# Patient Record
Sex: Male | Born: 1960 | Race: White | Hispanic: No | Marital: Married | Smoking: Never smoker
Health system: Southern US, Community
[De-identification: ages and names within clinical notes are randomized; demographics above are authoritative.]

## PROBLEM LIST (undated history)

## (undated) DIAGNOSIS — R011 Cardiac murmur, unspecified: Secondary | ICD-10-CM

## (undated) DIAGNOSIS — C61 Malignant neoplasm of prostate: Secondary | ICD-10-CM

## (undated) DIAGNOSIS — J309 Allergic rhinitis, unspecified: Secondary | ICD-10-CM

## (undated) DIAGNOSIS — E119 Type 2 diabetes mellitus without complications: Secondary | ICD-10-CM

## (undated) DIAGNOSIS — M1990 Unspecified osteoarthritis, unspecified site: Secondary | ICD-10-CM

## (undated) DIAGNOSIS — G4733 Obstructive sleep apnea (adult) (pediatric): Secondary | ICD-10-CM

## (undated) DIAGNOSIS — E785 Hyperlipidemia, unspecified: Secondary | ICD-10-CM

## (undated) DIAGNOSIS — K219 Gastro-esophageal reflux disease without esophagitis: Secondary | ICD-10-CM

## (undated) DIAGNOSIS — Z800 Family history of malignant neoplasm of digestive organs: Secondary | ICD-10-CM

## (undated) DIAGNOSIS — G4730 Sleep apnea, unspecified: Secondary | ICD-10-CM

## (undated) DIAGNOSIS — C801 Malignant (primary) neoplasm, unspecified: Secondary | ICD-10-CM

## (undated) DIAGNOSIS — T7840XA Allergy, unspecified, initial encounter: Secondary | ICD-10-CM

## (undated) DIAGNOSIS — I10 Essential (primary) hypertension: Secondary | ICD-10-CM

## (undated) HISTORY — DX: Essential (primary) hypertension: I10

## (undated) HISTORY — PX: HAND SURGERY: SHX662

## (undated) HISTORY — DX: Allergy, unspecified, initial encounter: T78.40XA

## (undated) HISTORY — PX: BASAL CELL CARCINOMA EXCISION: SHX1214

## (undated) HISTORY — DX: Family history of malignant neoplasm of digestive organs: Z80.0

## (undated) HISTORY — PX: NASAL SINUS SURGERY: SHX719

## (undated) HISTORY — DX: Gastro-esophageal reflux disease without esophagitis: K21.9

## (undated) HISTORY — DX: Malignant (primary) neoplasm, unspecified: C80.1

## (undated) HISTORY — DX: Allergic rhinitis, unspecified: J30.9

## (undated) HISTORY — DX: Hyperlipidemia, unspecified: E78.5

## (undated) HISTORY — DX: Unspecified osteoarthritis, unspecified site: M19.90

## (undated) HISTORY — DX: Sleep apnea, unspecified: G47.30

## (undated) HISTORY — DX: Cardiac murmur, unspecified: R01.1

## (undated) HISTORY — DX: Type 2 diabetes mellitus without complications: E11.9

## (undated) HISTORY — DX: Obstructive sleep apnea (adult) (pediatric): G47.33

## (undated) HISTORY — PX: PROSTATE BIOPSY: SHX241

## (undated) HISTORY — PX: KNEE SURGERY: SHX244

---

## 2013-08-17 ENCOUNTER — Ambulatory Visit: Payer: Self-pay | Admitting: Podiatry

## 2013-08-22 ENCOUNTER — Encounter: Payer: Self-pay | Admitting: Podiatry

## 2013-08-22 ENCOUNTER — Ambulatory Visit (INDEPENDENT_AMBULATORY_CARE_PROVIDER_SITE_OTHER): Payer: BC Managed Care – PPO | Admitting: Podiatry

## 2013-08-22 VITALS — BP 150/94 | HR 69 | Resp 12

## 2013-08-22 DIAGNOSIS — B351 Tinea unguium: Secondary | ICD-10-CM

## 2013-08-22 NOTE — Progress Notes (Signed)
Subjective:     Patient ID: Brian Beasley, male   DOB: 10/25/1961, 52 y.o.   MRN: [id number]  HPI patient states they seem a little better but I still have yellow discoloration especially in my right big toenail   Review of Systems  All other systems reviewed and are negative.       Objective:   Physical Exam  Nursing note and vitals reviewed. Cardiovascular: Intact distal pulses.   Musculoskeletal: Normal range of motion.  Neurological: He is alert.   patient has yellow discoloration in the hallux nail right with well healed nail borders hallux bilateral    Assessment:     Continued mycotic nail infection despite oral Lamisil right hallux and well-healed ingrown toenail sites bilateral    Plan:     Education on continue topical usage was given and patient will be seen back in 3 months for consideration of laser if fungal infection has not cleared up.

## 2013-11-05 ENCOUNTER — Emergency Department (HOSPITAL_COMMUNITY): Payer: BC Managed Care – PPO

## 2013-11-05 ENCOUNTER — Emergency Department (HOSPITAL_COMMUNITY)
Admission: EM | Admit: 2013-11-05 | Discharge: 2013-11-05 | Disposition: A | Discharge status: Home / Self Care | Payer: BC Managed Care – PPO | Attending: Emergency Medicine | Admitting: Emergency Medicine

## 2013-11-05 ENCOUNTER — Encounter (HOSPITAL_COMMUNITY): Payer: Self-pay | Admitting: Emergency Medicine

## 2013-11-05 DIAGNOSIS — K829 Disease of gallbladder, unspecified: Secondary | ICD-10-CM | POA: Insufficient documentation

## 2013-11-05 DIAGNOSIS — Z7982 Long term (current) use of aspirin: Secondary | ICD-10-CM | POA: Insufficient documentation

## 2013-11-05 DIAGNOSIS — R079 Chest pain, unspecified: Secondary | ICD-10-CM | POA: Insufficient documentation

## 2013-11-05 DIAGNOSIS — Z79899 Other long term (current) drug therapy: Secondary | ICD-10-CM | POA: Insufficient documentation

## 2013-11-05 LAB — CBC
HCT: 42.9 % (ref 39.0–52.0)
Hemoglobin: 15.1 g/dL (ref 13.0–17.0)
MCH: 32.4 pg (ref 26.0–34.0)
MCHC: 35.2 g/dL (ref 30.0–36.0)
MCV: 92.1 fL (ref 78.0–100.0)
Platelets: 233 10*3/uL (ref 150–400)
RBC: 4.66 MIL/uL (ref 4.22–5.81)
RDW: 12.1 % (ref 11.5–15.5)
WBC: 9.5 10*3/uL (ref 4.0–10.5)

## 2013-11-05 LAB — POCT I-STAT TROPONIN I
TROPONIN I, POC: 0 ng/mL (ref 0.00–0.08)
Troponin i, poc: 0 ng/mL (ref 0.00–0.08)

## 2013-11-05 LAB — PRO B NATRIURETIC PEPTIDE

## 2013-11-05 LAB — HEPATIC FUNCTION PANEL
ALBUMIN: 4.4 g/dL (ref 3.5–5.2)
ALT: 44 U/L (ref 0–53)
ALT: 96 U/L — ABNORMAL HIGH (ref 0–53)
AST: 27 U/L (ref 0–37)
AST: 96 U/L — ABNORMAL HIGH (ref 0–37)
Albumin: 4 g/dL (ref 3.5–5.2)
Alkaline Phosphatase: 93 U/L (ref 39–117)
Alkaline Phosphatase: 90 U/L (ref 39–117)
Bilirubin, Direct: <0.2 mg/dL (ref 0.0–0.3)
Bilirubin, Direct: <0.2 mg/dL (ref 0.0–0.3)
TOTAL PROTEIN: 7.7 g/dL (ref 6.0–8.3)
Total Bilirubin: 0.3 mg/dL (ref 0.3–1.2)
Total Bilirubin: 0.5 mg/dL (ref 0.3–1.2)
Total Protein: 6.9 g/dL (ref 6.0–8.3)

## 2013-11-05 LAB — BASIC METABOLIC PANEL
BUN: 15 mg/dL (ref 6–23)
CALCIUM: 9.2 mg/dL (ref 8.4–10.5)
CO2: 25 mEq/L (ref 19–32)
Chloride: 102 mEq/L (ref 96–112)
Creatinine, Ser: 1.49 mg/dL — ABNORMAL HIGH (ref 0.50–1.35)
GFR calc non Af Amer: 51 mL/min — ABNORMAL LOW (ref >90)
GFR, EST AFRICAN AMERICAN: 59 mL/min — AB (ref >90)
Glucose, Bld: 171 mg/dL — ABNORMAL HIGH (ref 70–99)
POTASSIUM: 4.1 meq/L (ref 3.7–5.3)
SODIUM: 142 meq/L (ref 137–147)

## 2013-11-05 LAB — LIPASE, BLOOD
LIPASE: 43 U/L (ref 11–59)
LIPASE: 33 U/L (ref 11–59)

## 2013-11-05 MED ORDER — FENTANYL CITRATE 0.05 MG/ML IJ SOLN
50.0000 ug | Freq: Once | INTRAMUSCULAR | Status: AC
  Administered 2013-11-05: 50 ug via INTRAVENOUS
  Filled 2013-11-05: qty 2

## 2013-11-05 MED ORDER — OXYCODONE-ACETAMINOPHEN 5-325 MG PO TABS: 1.0000 | ORAL_TABLET | ORAL | Status: DC | PRN

## 2013-11-05 MED ORDER — FENTANYL CITRATE 0.05 MG/ML IJ SOLN
50.0000 ug | Freq: Once | INTRAMUSCULAR | Status: AC
  Administered 2013-11-05: 50 ug via INTRAVENOUS

## 2013-11-05 MED ORDER — ASPIRIN 81 MG PO CHEW
324.0000 mg | CHEWABLE_TABLET | Freq: Once | ORAL | Status: AC
  Administered 2013-11-05: 243 mg via ORAL
  Filled 2013-11-05: qty 4

## 2013-11-05 MED ORDER — ONDANSETRON HCL 8 MG PO TABS: 8.0000 mg | ORAL_TABLET | Freq: Three times a day (TID) | ORAL | Status: DC | PRN

## 2013-11-05 MED ORDER — MORPHINE SULFATE 4 MG/ML IJ SOLN
4.0000 mg | Freq: Once | INTRAMUSCULAR | Status: AC
  Administered 2013-11-05: 4 mg via INTRAVENOUS
  Filled 2013-11-05: qty 1

## 2013-11-05 MED ORDER — GI COCKTAIL ~~LOC~~
30.0000 mL | Freq: Once | ORAL | Status: AC
  Administered 2013-11-05: 30 mL via ORAL
  Filled 2013-11-05: qty 30

## 2013-11-05 MED ORDER — PANTOPRAZOLE SODIUM 40 MG IV SOLR
40.0000 mg | Freq: Once | INTRAVENOUS | Status: AC
  Administered 2013-11-05: 40 mg via INTRAVENOUS
  Filled 2013-11-05: qty 40

## 2013-11-05 MED ORDER — ONDANSETRON HCL 4 MG/2ML IJ SOLN
4.0000 mg | Freq: Once | INTRAMUSCULAR | Status: AC
  Administered 2013-11-05: 4 mg via INTRAVENOUS
  Filled 2013-11-05: qty 2

## 2013-11-05 NOTE — Discharge Instructions (Signed)
Return for the HIDA scan as scheduled; 11/07/13, at 10:45 AM, to the Radiology Admitting Office, at [facility]. NOTHING by mouth after Midnight on Sunday, including pain medication. In the meantime avoid fats in your diet as much as possible.   HIDA (Hepatobiliary) Scan Your caregiver has suggested that you have a HIDA Scan. This is also known as a hepatobiliary scan. The HIDA Scan helps evaluate the hepatobiliary system (liver and gallbladder and their ducts). Your liver is the organ in your body that produces bile. The bile is then collected in the gallbladder. The bile is stored and concentrated in the gallbladder. The bile is excreted (passed) into the small intestine when it is needed for digestion. A stone can block the duct (tube) leading from the gallbladder to the small intestine. This can cause an inflammation of the gallbladder (cholecystitis). Because bile is always needed for fat processing, you may feel a gallbladder attack especially after eating a fatty meal. LET YOUR CAREGIVER KNOW ABOUT:  Allergies.  Medications taken including herbs, eye drops, over the counter medications, and creams.  Use of steroids (by mouth or creams).  Previous problems with anesthetics or novocaine.  Possibility of pregnancy, if this applies.  History of blood clots (thrombophlebitis).  History of bleeding or blood problems.  Previous surgery.  Other health problems. BEFORE THE PROCEDURE  Do not eat or drink anything after midnight the night before the exam as instructed.  You may take medications with a small amount of water the morning of the exam unless your caregiver instructs you otherwise. You should be present 60 minutes prior to your procedure or as directed.  PROCEDURE   An IV will be placed in your arm and remain throughout the exam.  A small amount of very short acting radioactive material will be injected into the IV.  While lying down a special camera will be placed over  your abdomen (belly). This camera is used to detect the injected material. The camera will place images on film. A radiologist (specialist in reading x-rays) can evaluate the images. It will help determine how well your gallbladder is working.  You will then be given a material called CCK. This will make your gallbladder contract. It occasionally causes symptoms (problems) that mimic a gallbladder attack or the feeling you have after eating a fatty meal.  The entire test usually takes one to two hours. Your caregiver can give you more accurate times. Following the test you may go home and resume normal activities and diet as instructed. Ask your caregiver how you are to find out your results. Remember, it is your responsibility to find out the results of your test. Do not assume everything is all right or "normal" if you have not heard from your caregiver. Document Released: 10/10/2000 Document Revised: 01/05/2012 Document Reviewed: 10/13/2005 [facility] Patient Information 2014 [place], [place].  Fat and Cholesterol Control Diet Fat and cholesterol levels in your blood and organs are influenced by your diet. High levels of fat and cholesterol may lead to diseases of the heart, small and large blood vessels, gallbladder, liver, and pancreas. CONTROLLING FAT AND CHOLESTEROL WITH DIET Although exercise and lifestyle factors are important, your diet is key. That is because certain foods are known to raise cholesterol and others to lower it. The goal is to balance foods for their effect on cholesterol and more importantly, to replace saturated and trans fat with other types of fat, such as monounsaturated fat, polyunsaturated fat, and omega-3 fatty acids. On  average, a person should consume no more than 15 to 17 g of saturated fat daily. Saturated and trans fats are considered "bad" fats, and they will raise LDL cholesterol. Saturated fats are primarily found in animal products such as meats, butter, and  cream. However, that does not mean you need to give up all your favorite foods. Today, there are good tasting, low-fat, low-cholesterol substitutes for most of the things you like to eat. Choose low-fat or nonfat alternatives. Choose round or loin cuts of red meat. These types of cuts are lowest in fat and cholesterol. Chicken (without the skin), fish, veal, and ground [place] breast are great choices. Eliminate fatty meats, such as hot dogs and salami. Even shellfish have little or no saturated fat. Have a 3 oz (85 g) portion when you eat lean meat, poultry, or fish. Trans fats are also called "partially hydrogenated oils." They are oils that have been scientifically manipulated so that they are solid at room temperature resulting in a longer shelf life and improved taste and texture of foods in which they are added. Trans fats are found in stick margarine, some tub margarines, cookies, crackers, and baked goods.  When baking and cooking, oils are a great substitute for butter. The monounsaturated oils are especially beneficial since it is believed they lower LDL and raise HDL. The oils you should avoid entirely are saturated tropical oils, such as coconut and palm.  Remember to eat a lot from food groups that are naturally free of saturated and trans fat, including fish, fruit, vegetables, beans, grains (barley, rice, couscous, bulgur wheat), and pasta (without cream sauces).  IDENTIFYING FOODS THAT LOWER FAT AND CHOLESTEROL  Soluble fiber may lower your cholesterol. This type of fiber is found in fruits such as apples, vegetables such as broccoli, potatoes, and carrots, legumes such as beans, peas, and lentils, and grains such as barley. Foods fortified with plant sterols (phytosterol) may also lower cholesterol. You should eat at least 2 g per day of these foods for a cholesterol lowering effect.  Read package labels to identify low-saturated fats, trans fat free, and low-fat foods at the supermarket.  Select cheeses that have only 2 to 3 g saturated fat per ounce. Use a heart-healthy tub margarine that is free of trans fats or partially hydrogenated oil. When buying baked goods (cookies, crackers), avoid partially hydrogenated oils. Breads and muffins should be made from whole grains (whole-wheat or whole oat flour, instead of "flour" or "enriched flour"). Buy non-creamy canned soups with reduced salt and no added fats.  FOOD PREPARATION TECHNIQUES  Never deep-fry. If you must fry, either stir-fry, which uses very little fat, or use non-stick cooking sprays. When possible, broil, bake, or roast meats, and steam vegetables. Instead of putting butter or margarine on vegetables, use lemon and herbs, applesauce, and cinnamon (for squash and sweet potatoes). Use nonfat yogurt, salsa, and low-fat dressings for salads.  LOW-SATURATED FAT / LOW-FAT FOOD SUBSTITUTES Meats / Saturated Fat (g)  Avoid: Steak, marbled (3 oz/85 g) / 11 g  Choose: Steak, lean (3 oz/85 g) / 4 g  Avoid: Hamburger (3 oz/85 g) / 7 g  Choose: Hamburger, lean (3 oz/85 g) / 5 g  Avoid: Ham (3 oz/85 g) / 6 g  Choose: Ham, lean cut (3 oz/85 g) / 2.4 g  Avoid: Chicken, with skin, dark meat (3 oz/85 g) / 4 g  Choose: Chicken, skin removed, dark meat (3 oz/85 g) / 2 g  Avoid: Chicken, with  skin, light meat (3 oz/85 g) / 2.5 g  Choose: Chicken, skin removed, light meat (3 oz/85 g) / 1 g Dairy / Saturated Fat (g)  Avoid: Whole milk (1 cup) / 5 g  Choose: Low-fat milk, 2% (1 cup) / 3 g  Choose: Low-fat milk, 1% (1 cup) / 1.5 g  Choose: Skim milk (1 cup) / 0.3 g  Avoid: Hard cheese (1 oz/28 g) / 6 g  Choose: Skim milk cheese (1 oz/28 g) / 2 to 3 g  Avoid: Cottage cheese, 4% fat (1 cup) / 6.5 g  Choose: Low-fat cottage cheese, 1% fat (1 cup) / 1.5 g  Avoid: Ice cream (1 cup) / 9 g  Choose: Sherbet (1 cup) / 2.5 g  Choose: Nonfat frozen yogurt (1 cup) / 0.3 g  Choose: Frozen fruit bar / trace  Avoid: Whipped  cream (1 tbs) / 3.5 g  Choose: Nondairy whipped topping (1 tbs) / 1 g Condiments / Saturated Fat (g)  Avoid: Mayonnaise (1 tbs) / 2 g  Choose: Low-fat mayonnaise (1 tbs) / 1 g  Avoid: Butter (1 tbs) / 7 g  Choose: Extra light margarine (1 tbs) / 1 g  Avoid: Coconut oil (1 tbs) / 11.8 g  Choose: Olive oil (1 tbs) / 1.8 g  Choose: Corn oil (1 tbs) / 1.7 g  Choose: Safflower oil (1 tbs) / 1.2 g  Choose: Sunflower oil (1 tbs) / 1.4 g  Choose: Soybean oil (1 tbs) / 2.4 g  Choose: Canola oil (1 tbs) / 1 g Document Released: 10/13/2005 Document Revised: 02/07/2013 Document Reviewed: 04/03/2011 [employer] Patient Information 2014 [place], [place].

## 2013-11-05 NOTE — ED Notes (Signed)
"  feels better", resting sleeping with sonorous resps, arousable to voice, alert, NAD, calm, interactive, wife at [facility], pt has OSA and uses CPAP at home, VSS.

## 2013-11-05 NOTE — ED Notes (Signed)
Last ate 1930, pain woke pt up about 0200, gradual restlessness with epigastric pressure, h/o similar 2-3 times in last week, this is worse, "feels like if I could just poop or belch I would feel better", (denies: sob, diarrhea, dizziness, fever, cough or cold sx). H/o acid reflux. pinpoints pain to epigastric mid upper abd. Pt calmer now. Skin W&D, resps e/u, speaking in clear complete sentences. LS CTA.

## 2013-11-05 NOTE — ED Notes (Signed)
Pt had 1-81mg  ASA PTA.

## 2013-11-05 NOTE — ED Notes (Addendum)
Woke up with rt. Center cp/epigastric. Some nausea and sob. Off/on x 2 weeks. Sometimes can walk it off and feel better. No rebound tenderness on palpation.

## 2013-11-05 NOTE — ED Provider Notes (Signed)
CSN: [id number]     Arrival date & time 11/05/13  3329 History   First MD Initiated Contact with Patient 11/05/13 0449     Chief Complaint  Patient presents with  . Chest Pain   (Consider location/radiation/quality/duration/timing/severity/associated sxs/prior Treatment) Patient is a 53 y.o. male presenting with chest pain. The history is provided by the patient and the spouse.  Chest Pain Brian Beasley is a 53 y.o. male who was awakened at 2 AM this morning by lower chest pain. He has had similar episodes on and off for 3 weeks, only at nighttime. The pain is 7-8/10. There is no associated shortness of breath or cough. He feels somewhat nauseated, but has not vomited. In the past he has tried antacids without relief of this pain. This pain seems different from his usual reflux pain. He has not had pain in the daytime nor postprandial pain. He is using his usual medications, without relief. There are no other known modifying factors.   History reviewed. No pertinent past medical history. History reviewed. No pertinent past surgical history. No family history on file. History  Substance Use Topics  . Smoking status: Never Smoker   . Smokeless tobacco: Not on file  . Alcohol Use: No    Review of Systems  Cardiovascular: Positive for chest pain.  All other systems reviewed and are negative.    Allergies  Review of patient's allergies indicates no known allergies.  Home Medications   Current Outpatient Rx  Name  Route  Sig  Dispense  Refill  . aspirin 81 MG chewable tablet   Oral   Chew 81 mg by mouth daily.         . fexofenadine (ALLEGRA) 180 MG tablet   Oral   Take 180 mg by mouth daily.         Marland Kitchen omeprazole (PRILOSEC OTC) 20 MG tablet   Oral   Take 20 mg by mouth daily.         Marland Kitchen ZETIA 10 MG tablet   Oral   Take 10 mg by mouth daily.          . ondansetron (ZOFRAN) 8 MG tablet   Oral   Take 1 tablet (8 mg total) by mouth every 8 (eight) hours as needed for  nausea or vomiting.   20 tablet   0   . oxyCODONE-acetaminophen (PERCOCET) 5-325 MG per tablet   Oral   Take 1 tablet by mouth every 4 (four) hours as needed for severe pain.   20 tablet   0    BP 118/51  Pulse 56  Temp(Src) 97.5 F (36.4 C) (Oral)  Resp 16  SpO2 100% Physical Exam  Nursing note and vitals reviewed. Constitutional: He is oriented to person, place, and time. He appears well-developed and well-nourished.  HENT:  Head: Normocephalic and atraumatic.  Right Ear: External ear normal.  Left Ear: External ear normal.  Eyes: Conjunctivae and EOM are normal. Pupils are equal, round, and reactive to light.  Neck: Normal range of motion and phonation normal. Neck supple.  Cardiovascular: Normal rate, regular rhythm, normal heart sounds and intact distal pulses.   Pulmonary/Chest: Effort normal and breath sounds normal. He has no wheezes. He exhibits no tenderness and no bony tenderness.  Abdominal: Soft. Normal appearance. He exhibits no mass. There is no tenderness. There is no guarding.  Musculoskeletal: Normal range of motion. He exhibits no edema and no tenderness.  Neurological: He is alert and oriented to person, place,  and time. No cranial nerve deficit or sensory deficit. He exhibits normal muscle tone. Coordination normal.  Skin: Skin is warm, dry and intact.  Psychiatric: He has a normal mood and affect. His behavior is normal. Judgment and thought content normal.    ED Course  Procedures (including critical care time) Medications  fentaNYL (SUBLIMAZE) injection 50 mcg (50 mcg Intravenous Given 11/05/13 0342)  ondansetron (ZOFRAN) injection 4 mg (4 mg Intravenous Given 11/05/13 0341)  aspirin chewable tablet 324 mg (243 mg Oral Given 11/05/13 0351)  fentaNYL (SUBLIMAZE) injection 50 mcg (50 mcg Intravenous Given 11/05/13 0356)  gi cocktail (Maalox,Lidocaine,Donnatal) (30 mLs Oral Given 11/05/13 0418)  pantoprazole (PROTONIX) injection 40 mg (40 mg Intravenous  Given 11/05/13 0514)  morphine 4 MG/ML injection 4 mg (4 mg Intravenous Given 11/05/13 0514)    Patient Vitals for the past 24 hrs:  BP Temp Temp src Pulse Resp SpO2  11/05/13 0819 118/51 mmHg - - 56 16 100 %  11/05/13 0709 139/65 mmHg - - 56 16 99 %  11/05/13 0545 146/73 mmHg - - 54 18 100 %  11/05/13 0530 141/72 mmHg - - 55 11 99 %  11/05/13 0515 143/80 mmHg - - 54 - 100 %  11/05/13 0500 131/55 mmHg - - 55 - 100 %  11/05/13 0448 - - - 49 7 100 %  11/05/13 0446 139/71 mmHg - - - - -  11/05/13 0415 132/53 mmHg - - 49 12 100 %  11/05/13 0400 139/66 mmHg - - 52 16 100 %  11/05/13 0318 159/84 mmHg 97.5 F (36.4 C) Oral 62 20 97 %    08:04 AM Reevaluation with update and discussion. After initial assessment and treatment, an updated evaluation reveals he is comfortable now. There is no pain.Daleen Bo L   08:05- I discussed the case with the on-call surgeon Dr. Georgette Dover. He recommends getting a HIDA scan with gallbladder ejection fraction;  as an outpatient and following up with him in the office  Labs Review Labs Reviewed  BASIC METABOLIC PANEL - Abnormal; Notable for the following:    Glucose, Bld 171 (*)    Creatinine, Ser 1.49 (*)    GFR calc non Af Amer 51 (*)    GFR calc Af Amer 59 (*)    All other components within normal limits  HEPATIC FUNCTION PANEL - Abnormal; Notable for the following:    AST 96 (*)    ALT 96 (*)    All other components within normal limits  CBC  PRO B NATRIURETIC PEPTIDE  HEPATIC FUNCTION PANEL  LIPASE, BLOOD  LIPASE, BLOOD  POCT I-STAT TROPONIN I  POCT I-STAT TROPONIN I   Imaging Review Dg Chest 2 View  11/05/2013   CLINICAL DATA:  Chest pain.  EXAM: CHEST  2 VIEW  COMPARISON:  None.  FINDINGS: Lung volumes are low but the lungs are clear. Heart size is upper normal. No pneumothorax or pleural effusion.  IMPRESSION: No acute disease.   Electronically Signed   By: Inge Rise M.D.   On: 11/05/2013 04:39   US Abdomen Complete  11/05/2013    CLINICAL DATA:  Abdominal pain.  EXAM: ULTRASOUND ABDOMEN COMPLETE  COMPARISON:  None.  FINDINGS: Gallbladder:  Large amount of echogenic sludge within the gallbladder. No shadowing calculi. 5 mm polyp involving the neck of the gallbladder. No gallbladder wall thickening or pericholecystic fluid. Negative sonographic Murphy sign according to the ultrasound technologist.  Common bile duct:  Diameter: 4 mm.  Liver:  Diffusely increased and coarsened echotexture without focal hepatic parenchymal abnormality. Patent portal vein with hepatopetal flow.  IVC:  Normal.  Pancreas:  Not visualized due to midline bowel gas.  Spleen:  Normal size and echotexture without focal parenchymal abnormality.  Right Kidney:  Length: 12.4 cm. No hydronephrosis. Well-preserved cortex. No shadowing calculi. Normal parenchymal echotexture without focal abnormalities.  Left Kidney:  Length: 12.8 cm. No hydronephrosis. Well-preserved cortex. No shadowing calculi. Normal parenchymal echotexture without focal abnormalities.  Abdominal aorta:  Normal in caliber through its visualized course in the abdomen, obscured distally. Maximum diameter 2.3 cm.  Other findings:  None.  IMPRESSION: 1. Large amount of gallbladder sludge. No visible shadowing calculi. No sonographic evidence of cholecystitis. 2. 5 mm polyp in the gallbladder neck. 3. Diffuse hepatic steatosis and/or hepatocellular disease without focal hepatic parenchymal abnormality. 4. Nonvisualization of the pancreas due to midline bowel gas.   Electronically Signed   By: Evangeline Dakin M.D.   On: 11/05/2013 07:11    EKG Interpretation    Date/Time:  Saturday November 05 2013 63:87:56 EST Ventricular Rate:  54 PR Interval:  158 QRS Duration: 104 QT Interval:  458 QTC Calculation: 434 R Axis:   -5 Text Interpretation:  Sinus rhythm Since last tracing of earlier today No significant change was found Confirmed by Salik Grewell  MD, Adi Doro ([id number]) on 11/05/2013 6:50:41 AM             MDM   1. Gallbladder disease      Chest and upper abdominal pain, likely related to gallbladder disease. No evidence for obstructive pattern on enzyme testing. His pain is improved, and he is stable for discharge.   Nursing Notes Reviewed/ Care Coordinated Applicable Imaging Reviewed Interpretation of Laboratory Data incorporated into ED treatment  The patient appears reasonably screened and/or stabilized for discharge and I doubt any other medical condition or other [facility] requiring further screening, evaluation, or treatment in the ED at this time prior to discharge.  Plan: Home Medications- Percocet, Zofran; Home Treatments- low fat diet; return here if the recommended treatment, does not improve the symptoms; Recommended follow up- Dr. Georgette Dover after HIDA scan    Richarda Blade, MD 11/05/13 [phone]

## 2013-11-07 ENCOUNTER — Ambulatory Visit (HOSPITAL_COMMUNITY)
Admission: RE | Admit: 2013-11-07 | Discharge: 2013-11-07 | Disposition: A | Discharge status: Home / Self Care | Payer: BC Managed Care – PPO | Source: Ambulatory Visit | Attending: Surgery | Admitting: Surgery

## 2013-11-07 ENCOUNTER — Telehealth (INDEPENDENT_AMBULATORY_CARE_PROVIDER_SITE_OTHER): Payer: Self-pay

## 2013-11-07 ENCOUNTER — Encounter (HOSPITAL_COMMUNITY)
Admission: RE | Admit: 2013-11-07 | Discharge: 2013-11-07 | Disposition: A | Discharge status: Home / Self Care | Payer: BC Managed Care – PPO | Source: Ambulatory Visit | Attending: Emergency Medicine | Admitting: Emergency Medicine

## 2013-11-07 DIAGNOSIS — R1011 Right upper quadrant pain: Secondary | ICD-10-CM | POA: Insufficient documentation

## 2013-11-07 MED ORDER — TECHNETIUM TC 99M MEBROFENIN IV KIT
5.0000 | PACK | Freq: Once | INTRAVENOUS | Status: AC | PRN
  Administered 2013-11-07: 5 via INTRAVENOUS

## 2013-11-07 NOTE — Telephone Encounter (Signed)
Calling asking for [place] to place the NM hepat w/ejc fraction back into epic b/c the pt's order got deleated when the pt checked into the hospital. I placed new order in epic.

## 2013-11-14 ENCOUNTER — Ambulatory Visit: Payer: BC Managed Care – PPO | Admitting: Podiatry

## 2013-11-21 ENCOUNTER — Encounter: Payer: Self-pay | Admitting: Podiatry

## 2013-11-21 ENCOUNTER — Ambulatory Visit (INDEPENDENT_AMBULATORY_CARE_PROVIDER_SITE_OTHER): Payer: BC Managed Care – PPO | Admitting: Surgery

## 2013-11-21 ENCOUNTER — Ambulatory Visit (INDEPENDENT_AMBULATORY_CARE_PROVIDER_SITE_OTHER): Payer: BC Managed Care – PPO | Admitting: Podiatry

## 2013-11-21 ENCOUNTER — Encounter (INDEPENDENT_AMBULATORY_CARE_PROVIDER_SITE_OTHER): Payer: Self-pay | Admitting: Surgery

## 2013-11-21 VITALS — BP 180/79 | HR 74 | Resp 16

## 2013-11-21 VITALS — BP 138/90 | HR 92 | Temp 98.0°F | Resp 15 | Ht 73.0 in | Wt 278.6 lb

## 2013-11-21 DIAGNOSIS — B351 Tinea unguium: Secondary | ICD-10-CM

## 2013-11-21 DIAGNOSIS — K811 Chronic cholecystitis: Secondary | ICD-10-CM | POA: Insufficient documentation

## 2013-11-21 DIAGNOSIS — L600 Ingrowing nail: Secondary | ICD-10-CM

## 2013-11-21 NOTE — Progress Notes (Signed)
Subjective:     Patient ID: Brian Beasley, male   DOB: May 23, 1961, 53 y.o.   MRN: [id number]  HPI patient states I wanted my nails checked to make sure that there is no fungus or that they are getting ingrown again   Review of Systems     Objective:   Physical Exam Neurovascular status intact with healing nail sites bilateral hallux with discomfort of the mild nature and some yellow discoloration left over right    Assessment:     Mycotic nail infection foot ingrown component left over right    Plan:     Advised patient on topical formulas 3 and that the nails are growing well

## 2013-11-21 NOTE — Progress Notes (Signed)
Patient ID: Brian Beasley, male   DOB: 08/17/1961, 53 y.o.   MRN: [id number]  Chief Complaint  Patient presents with  . New Evaluation    eval GB    HPI Brian Beasley is a 53 y.o. male.  Referred by Dr. Darcus Austin for evaluation of gallbladder disease  HPI This is a 53 year old male who presents with a recent emergency department visit for acute epigastric and right upper quadrant abdominal pain. Initially he felt that this was chest pain see was evaluated and ruled out for cardiac disease. Ultrasound showed no sign of gallstones but he did have some sludge. His symptoms improved and he was discharged home with an outpatient HIDA scan. This showed a decreased gallbladder ejection fraction.  Upon further questioning, the patient admits to increased abdominal symptoms with more flatulence and belching. He notices that after eating greasy meals he gets some mild nausea as well as bloating. He does have some milder episodes of this right upper quadrant abdominal pain. He denies any jaundice. His bowel movements remain normal. Past Medical History  Diagnosis Date  . Cancer   . Hyperlipidemia   Basal cell carcinoma   Past Surgical History  Procedure Laterality Date  . Hand surgery      L hand, had 3 screws put in to repair bone  . Nasal sinus surgery      opened passages to improve drainage  . Basal cell carcinoma excision      right cheek    History reviewed. No pertinent family history.  Social History History  Substance Use Topics  . Smoking status: Never Smoker   . Smokeless tobacco: Never Used  . Alcohol Use: Yes     Comment: socially    No Known Allergies  Current Outpatient Prescriptions  Medication Sig Dispense Refill  . aspirin 81 MG chewable tablet Chew 81 mg by mouth daily.      . fexofenadine (ALLEGRA) 180 MG tablet Take 180 mg by mouth daily.      Marland Kitchen omeprazole (PRILOSEC OTC) 20 MG tablet Take 20 mg by mouth daily.      Marland Kitchen ZETIA 10 MG tablet Take 10 mg by mouth  daily.       . ondansetron (ZOFRAN) 8 MG tablet Take 1 tablet (8 mg total) by mouth every 8 (eight) hours as needed for nausea or vomiting.  20 tablet  0  . oxyCODONE-acetaminophen (PERCOCET) 5-325 MG per tablet Take 1 tablet by mouth every 4 (four) hours as needed for severe pain.  20 tablet  0   No current facility-administered medications for this visit.    Review of Systems Review of Systems  Constitutional: Negative for fever, chills and unexpected weight change.  HENT: Negative for congestion, hearing loss, sore throat, trouble swallowing and voice change.   Eyes: Negative for visual disturbance.  Respiratory: Negative for cough and wheezing.   Cardiovascular: Negative for chest pain, palpitations and leg swelling.  Gastrointestinal: Positive for nausea, abdominal pain and abdominal distention. Negative for vomiting, diarrhea, constipation, blood in stool, anal bleeding and rectal pain.  Genitourinary: Negative for hematuria and difficulty urinating.  Musculoskeletal: Negative for arthralgias.  Skin: Negative for rash and wound.  Neurological: Negative for seizures, syncope, weakness and headaches.  Hematological: Negative for adenopathy. Does not bruise/bleed easily.  Psychiatric/Behavioral: Negative for confusion.    Blood pressure 138/90, pulse 92, temperature 98 F (36.7 C), temperature source Temporal, resp. rate 15, height 6\' 1"  (1.854 m), weight 278 lb 9.6  oz (126.372 kg).  Physical Exam Physical Exam WDWN in NAD HEENT:  EOMI, sclera anicteric Neck:  No masses, no thyromegaly Lungs:  CTA bilaterally; normal respiratory effort CV:  Regular rate and rhythm; no murmurs Abd:  +bowel sounds, soft, non-tender, no masses Ext:  Well-perfused; no edema Skin:  Warm, dry; no sign of jaundice  Data Reviewed Dg Chest 2 View  11/05/2013   CLINICAL DATA:  Chest pain.  EXAM: CHEST  2 VIEW  COMPARISON:  None.  FINDINGS: Lung volumes are low but the lungs are clear. Heart size is  upper normal. No pneumothorax or pleural effusion.  IMPRESSION: No acute disease.   Electronically Signed   By: Inge Rise M.D.   On: 11/05/2013 04:39   US Abdomen Complete  11/05/2013   CLINICAL DATA:  Abdominal pain.  EXAM: ULTRASOUND ABDOMEN COMPLETE  COMPARISON:  None.  FINDINGS: Gallbladder:  Large amount of echogenic sludge within the gallbladder. No shadowing calculi. 5 mm polyp involving the neck of the gallbladder. No gallbladder wall thickening or pericholecystic fluid. Negative sonographic Murphy sign according to the ultrasound technologist.  Common bile duct:  Diameter: 4 mm.  Liver:  Diffusely increased and coarsened echotexture without focal hepatic parenchymal abnormality. Patent portal vein with hepatopetal flow.  IVC:  Normal.  Pancreas:  Not visualized due to midline bowel gas.  Spleen:  Normal size and echotexture without focal parenchymal abnormality.  Right Kidney:  Length: 12.4 cm. No hydronephrosis. Well-preserved cortex. No shadowing calculi. Normal parenchymal echotexture without focal abnormalities.  Left Kidney:  Length: 12.8 cm. No hydronephrosis. Well-preserved cortex. No shadowing calculi. Normal parenchymal echotexture without focal abnormalities.  Abdominal aorta:  Normal in caliber through its visualized course in the abdomen, obscured distally. Maximum diameter 2.3 cm.  Other findings:  None.  IMPRESSION: 1. Large amount of gallbladder sludge. No visible shadowing calculi. No sonographic evidence of cholecystitis. 2. 5 mm polyp in the gallbladder neck. 3. Diffuse hepatic steatosis and/or hepatocellular disease without focal hepatic parenchymal abnormality. 4. Nonvisualization of the pancreas due to midline bowel gas.   Electronically Signed   By: Evangeline Dakin M.D.   On: 11/05/2013 07:11   Nm Hepato W/eject Fract  11/07/2013   CLINICAL DATA:  Abdominal pain, right upper quadrant pain  EXAM: NUCLEAR MEDICINE HEPATOBILIARY IMAGING WITH GALLBLADDER EF  TECHNIQUE:  Sequential images of the abdomen were obtained out to 60 minutes following intravenous administration of radiopharmaceutical. After oral ingestion of Ensure, gallbladder ejection fraction was determined. At 60 min, normal ejection fraction is greater than 33%.  COMPARISON:  None.  RADIOPHARMACEUTICALS:  5.61mCi Tc-92m Choletec  FINDINGS: There is immediate homogeneous uptake of radiotracer in the liver. Filling of the gallbladder begins at 20 minutes. Radiotracer uptake is present in the small bowel at 30 minutes.  When gallbladder filling was complete, the patient was given PO Ensure. Gallbladder ejection fraction: 13.2 %. Normal gallbladder ejection fraction with Ensure is greater than 33%. The patient did not experience symptoms after oral ingestion of Ensure.  IMPRESSION: 1. No scintigraphic evidence of biliary obstruction. 2. Abnormal low gallbladder ejection fraction measuring 13.2% as can be seen with biliary dyskinesia.   Electronically Signed   By: Kathreen Devoid   On: 11/07/2013 14:16    Lab Results  Component Value Date   WBC 9.5 11/05/2013   HGB 15.1 11/05/2013   HCT 42.9 11/05/2013   MCV 92.1 11/05/2013   PLT 233 11/05/2013   Lab Results  Component Value Date   CREATININE  1.49* 11/05/2013   BUN 15 11/05/2013   NA 142 11/05/2013   K 4.1 11/05/2013   CL 102 11/05/2013   CO2 25 11/05/2013   Lab Results  Component Value Date   ALT 96* 11/05/2013   AST 96* 11/05/2013   ALKPHOS 90 11/05/2013   BILITOT 0.5 11/05/2013     Assessment    Biliary dyskinesia; chronic cholecystitis with sludge     Plan    Laparoscopic cholecystectomy with intraoperative cholangiogram.  The surgical procedure has been discussed with the patient.  Potential risks, benefits, alternative treatments, and expected outcomes have been explained.  All of the patient's questions at this time have been answered.  The likelihood of reaching the patient's treatment goal is good.  The patient understand the proposed surgical  procedure and wishes to proceed.         Azell Bill K. 11/21/2013, 11:51 AM

## 2013-11-24 ENCOUNTER — Encounter (HOSPITAL_COMMUNITY): Payer: Self-pay | Admitting: Pharmacy Technician

## 2013-11-25 ENCOUNTER — Encounter (HOSPITAL_COMMUNITY)
Admission: RE | Admit: 2013-11-25 | Discharge: 2013-11-25 | Disposition: A | Discharge status: Home / Self Care | Payer: BC Managed Care – PPO | Source: Ambulatory Visit | Attending: Surgery | Admitting: Surgery

## 2013-11-25 ENCOUNTER — Encounter (HOSPITAL_COMMUNITY): Payer: Self-pay

## 2013-11-25 DIAGNOSIS — Z01818 Encounter for other preprocedural examination: Secondary | ICD-10-CM | POA: Insufficient documentation

## 2013-11-25 NOTE — Progress Notes (Signed)
Labs results used from epic CBC CMP on 11-05-13 Chest X ray and EKG results used in epic from 11-05-2013

## 2013-11-25 NOTE — Patient Instructions (Signed)
Brian Beasley  11/25/2013   Your procedure is scheduled on:   Report to [facility] at  AM.  Call this number if you have problems the morning of surgery: [phone]   Due to [place] flu policy no visitors under age 55 at this time   Remember:   Do not eat food or drink liquids after midnight.   Take these medicines the morning of surgery with A SIP OF WATER:   Do not wear jewelry, make-up or nail polish.  Do not wear lotions, powders, or perfumes. You may wear deodorant.  Do not shave 48  hours prior to surgery. Men may shave face and neck.  Do not bring valuables to the hospital.  [facility] is not responsible for any belongings or valuables.                 Contacts, dentures or bridgework may not be worn into surgery.  Leave suitcase in the car. After surgery it may be brought to your room.  For patients admitted to the hospital, checkout time is 11:00 AM the day of discharge  .   Patients discharged the day of surgery will not be allowed to drive  home.  Name and phone number of your driver:   Special Instructions:   Shower using CHG 2 nights before surgery and the night before surgery.  If you shower the day of surgery use CHG.  Use special wash - you have one bottle of CHG for all showers.  You should use approximately 1/3 of the bottle for each shower.   Please read over the following fact sheets that you were given: MRSA Information       Questions please call  Karie Chimera RN     [phone]    Patient signature _______________________________________  Nurse signature ________________________________________

## 2013-11-30 MED ORDER — DEXTROSE 5 % IV SOLN
3.0000 g | INTRAVENOUS | Status: AC
  Administered 2013-12-01: 3 g via INTRAVENOUS
  Filled 2013-11-30 (×2): qty 3000

## 2013-11-30 NOTE — Anesthesia Preprocedure Evaluation (Addendum)

## 2013-12-01 ENCOUNTER — Ambulatory Visit (HOSPITAL_COMMUNITY)
Admission: RE | Admit: 2013-12-01 | Discharge: 2013-12-01 | Disposition: A | Discharge status: Home / Self Care | Payer: BC Managed Care – PPO | Source: Ambulatory Visit | Attending: Surgery | Admitting: Surgery

## 2013-12-01 ENCOUNTER — Encounter (HOSPITAL_COMMUNITY): Payer: BC Managed Care – PPO | Admitting: Anesthesiology

## 2013-12-01 ENCOUNTER — Ambulatory Visit (HOSPITAL_COMMUNITY): Payer: BC Managed Care – PPO

## 2013-12-01 ENCOUNTER — Ambulatory Visit (HOSPITAL_COMMUNITY): Payer: BC Managed Care – PPO | Admitting: Anesthesiology

## 2013-12-01 ENCOUNTER — Encounter (HOSPITAL_COMMUNITY)
Admission: RE | Disposition: A | Discharge status: Home / Self Care | Payer: Self-pay | Source: Ambulatory Visit | Attending: Surgery

## 2013-12-01 ENCOUNTER — Encounter (HOSPITAL_COMMUNITY): Payer: Self-pay | Admitting: *Deleted

## 2013-12-01 DIAGNOSIS — Z79899 Other long term (current) drug therapy: Secondary | ICD-10-CM | POA: Insufficient documentation

## 2013-12-01 DIAGNOSIS — K810 Acute cholecystitis: Secondary | ICD-10-CM

## 2013-12-01 DIAGNOSIS — K811 Chronic cholecystitis: Secondary | ICD-10-CM

## 2013-12-01 DIAGNOSIS — Z7982 Long term (current) use of aspirin: Secondary | ICD-10-CM | POA: Insufficient documentation

## 2013-12-01 DIAGNOSIS — E785 Hyperlipidemia, unspecified: Secondary | ICD-10-CM | POA: Insufficient documentation

## 2013-12-01 HISTORY — PX: CHOLECYSTECTOMY: SHX55

## 2013-12-01 SURGERY — LAPAROSCOPIC CHOLECYSTECTOMY WITH INTRAOPERATIVE CHOLANGIOGRAM
Anesthesia: General | Site: Abdomen

## 2013-12-01 MED ORDER — OXYCODONE-ACETAMINOPHEN 5-325 MG PO TABS: 1.0000 | ORAL_TABLET | ORAL | Status: DC | PRN

## 2013-12-01 MED ORDER — FENTANYL CITRATE 0.05 MG/ML IJ SOLN
INTRAMUSCULAR | Status: AC
  Filled 2013-12-01: qty 5

## 2013-12-01 MED ORDER — MIDAZOLAM HCL 5 MG/5ML IJ SOLN
INTRAMUSCULAR | Status: DC | PRN
  Administered 2013-12-01: 2 mg via INTRAVENOUS

## 2013-12-01 MED ORDER — KETAMINE HCL 10 MG/ML IJ SOLN
INTRAMUSCULAR | Status: DC | PRN
  Administered 2013-12-01: 60 mg via INTRAVENOUS

## 2013-12-01 MED ORDER — LACTATED RINGERS IV SOLN: INTRAVENOUS | Status: DC

## 2013-12-01 MED ORDER — ROCURONIUM BROMIDE 100 MG/10ML IV SOLN
INTRAVENOUS | Status: DC | PRN
  Administered 2013-12-01: 10 mg via INTRAVENOUS
  Administered 2013-12-01: 50 mg via INTRAVENOUS

## 2013-12-01 MED ORDER — MIDAZOLAM HCL 2 MG/2ML IJ SOLN
INTRAMUSCULAR | Status: AC
  Filled 2013-12-01: qty 2

## 2013-12-01 MED ORDER — PROPOFOL 10 MG/ML IV BOLUS
INTRAVENOUS | Status: AC
  Filled 2013-12-01: qty 20

## 2013-12-01 MED ORDER — GLYCOPYRROLATE 0.2 MG/ML IJ SOLN
INTRAMUSCULAR | Status: DC | PRN
  Administered 2013-12-01: 0.6 mg via INTRAVENOUS

## 2013-12-01 MED ORDER — 0.9 % SODIUM CHLORIDE (POUR BTL) OPTIME
TOPICAL | Status: DC | PRN
  Administered 2013-12-01: 1000 mL

## 2013-12-01 MED ORDER — ONDANSETRON HCL 4 MG/2ML IJ SOLN
INTRAMUSCULAR | Status: AC
  Filled 2013-12-01: qty 2

## 2013-12-01 MED ORDER — LACTATED RINGERS IR SOLN
Status: DC | PRN
  Administered 2013-12-01: 1

## 2013-12-01 MED ORDER — ONDANSETRON HCL 4 MG/2ML IJ SOLN
INTRAMUSCULAR | Status: DC | PRN
  Administered 2013-12-01: 4 mg via INTRAVENOUS

## 2013-12-01 MED ORDER — PROPOFOL 10 MG/ML IV BOLUS
INTRAVENOUS | Status: DC | PRN
  Administered 2013-12-01: 200 mg via INTRAVENOUS

## 2013-12-01 MED ORDER — BUPIVACAINE-EPINEPHRINE 0.25% -1:200000 IJ SOLN
INTRAMUSCULAR | Status: DC | PRN
  Administered 2013-12-01: 15 mL

## 2013-12-01 MED ORDER — METOCLOPRAMIDE HCL 5 MG/ML IJ SOLN
INTRAMUSCULAR | Status: AC
  Filled 2013-12-01: qty 2

## 2013-12-01 MED ORDER — PHENYLEPHRINE 40 MCG/ML (10ML) SYRINGE FOR IV PUSH (FOR BLOOD PRESSURE SUPPORT)
PREFILLED_SYRINGE | INTRAVENOUS | Status: AC
  Filled 2013-12-01: qty 10

## 2013-12-01 MED ORDER — NEOSTIGMINE METHYLSULFATE 1 MG/ML IJ SOLN
INTRAMUSCULAR | Status: DC | PRN
  Administered 2013-12-01: 5 mg via INTRAVENOUS

## 2013-12-01 MED ORDER — KETAMINE HCL 10 MG/ML IJ SOLN
INTRAMUSCULAR | Status: AC
  Filled 2013-12-01: qty 1

## 2013-12-01 MED ORDER — LACTATED RINGERS IV SOLN
INTRAVENOUS | Status: DC | PRN
  Administered 2013-12-01 (×3): via INTRAVENOUS

## 2013-12-01 MED ORDER — BUPIVACAINE-EPINEPHRINE PF 0.25-1:200000 % IJ SOLN
INTRAMUSCULAR | Status: AC
  Filled 2013-12-01: qty 30

## 2013-12-01 MED ORDER — CHLORHEXIDINE GLUCONATE 4 % EX LIQD: 1.0000 "application " | Freq: Once | CUTANEOUS | Status: DC

## 2013-12-01 MED ORDER — GLYCOPYRROLATE 0.2 MG/ML IJ SOLN
INTRAMUSCULAR | Status: AC
  Filled 2013-12-01: qty 3

## 2013-12-01 MED ORDER — FENTANYL CITRATE 0.05 MG/ML IJ SOLN
INTRAMUSCULAR | Status: DC | PRN
  Administered 2013-12-01: 50 ug via INTRAVENOUS
  Administered 2013-12-01 (×2): 100 ug via INTRAVENOUS

## 2013-12-01 MED ORDER — ONDANSETRON HCL 4 MG/2ML IJ SOLN: 4.0000 mg | INTRAMUSCULAR | Status: DC | PRN

## 2013-12-01 MED ORDER — PHENYLEPHRINE HCL 10 MG/ML IJ SOLN
INTRAMUSCULAR | Status: DC | PRN
  Administered 2013-12-01: 80 ug via INTRAVENOUS
  Administered 2013-12-01 (×2): 120 ug via INTRAVENOUS

## 2013-12-01 MED ORDER — METOCLOPRAMIDE HCL 5 MG/ML IJ SOLN
INTRAMUSCULAR | Status: DC | PRN
  Administered 2013-12-01: 10 mg via INTRAVENOUS

## 2013-12-01 MED ORDER — MORPHINE SULFATE 10 MG/ML IJ SOLN: 2.0000 mg | INTRAMUSCULAR | Status: DC | PRN

## 2013-12-01 MED ORDER — IOHEXOL 300 MG/ML  SOLN
INTRAMUSCULAR | Status: DC | PRN
  Administered 2013-12-01: 08:00:00

## 2013-12-01 MED ORDER — HYDROMORPHONE HCL PF 1 MG/ML IJ SOLN
INTRAMUSCULAR | Status: AC
  Filled 2013-12-01: qty 1

## 2013-12-01 MED ORDER — HYDROMORPHONE HCL PF 1 MG/ML IJ SOLN
0.2500 mg | INTRAMUSCULAR | Status: DC | PRN
  Administered 2013-12-01 (×2): 0.5 mg via INTRAVENOUS

## 2013-12-01 MED ORDER — DEXAMETHASONE SODIUM PHOSPHATE 10 MG/ML IJ SOLN
INTRAMUSCULAR | Status: AC
  Filled 2013-12-01: qty 1

## 2013-12-01 MED ORDER — DEXAMETHASONE SODIUM PHOSPHATE 4 MG/ML IJ SOLN
INTRAMUSCULAR | Status: DC | PRN
  Administered 2013-12-01: 10 mg via INTRAVENOUS

## 2013-12-01 MED ORDER — ROCURONIUM BROMIDE 100 MG/10ML IV SOLN
INTRAVENOUS | Status: AC
  Filled 2013-12-01: qty 1

## 2013-12-01 MED ORDER — NEOSTIGMINE METHYLSULFATE 1 MG/ML IJ SOLN
INTRAMUSCULAR | Status: AC
  Filled 2013-12-01: qty 10

## 2013-12-01 SURGICAL SUPPLY — 38 items
APPLIER CLIP ROT 10 11.4 M/L (STAPLE) ×3
BENZOIN TINCTURE PRP APPL 2/3 (GAUZE/BANDAGES/DRESSINGS) IMPLANT
CANISTER SUCTION 2500CC (MISCELLANEOUS) ×3 IMPLANT
CHLORAPREP W/TINT 26ML (MISCELLANEOUS) ×3 IMPLANT
CLIP APPLIE ROT 10 11.4 M/L (STAPLE) ×1 IMPLANT
CLOSURE WOUND 1/2 X4 (GAUZE/BANDAGES/DRESSINGS) ×1
COVER MAYO STAND STRL (DRAPES) ×3 IMPLANT
DECANTER SPIKE VIAL GLASS SM (MISCELLANEOUS) ×3 IMPLANT
DRAPE C-ARM 42X120 X-RAY (DRAPES) ×3 IMPLANT
DRAPE LAPAROSCOPIC ABDOMINAL (DRAPES) ×3 IMPLANT
DRAPE UTILITY XL STRL (DRAPES) ×3 IMPLANT
DRSG TEGADERM 2-3/8X2-3/4 SM (GAUZE/BANDAGES/DRESSINGS) IMPLANT
DRSG TEGADERM 4X4.75 (GAUZE/BANDAGES/DRESSINGS) IMPLANT
ELECT REM PT RETURN 9FT ADLT (ELECTROSURGICAL) ×3
ELECTRODE REM PT RTRN 9FT ADLT (ELECTROSURGICAL) ×1 IMPLANT
FILTER SMOKE EVAC LAPAROSHD (FILTER) ×3 IMPLANT
GLOVE BIO SURGEON STRL SZ7 (GLOVE) ×3 IMPLANT
GLOVE BIOGEL PI IND STRL 7.5 (GLOVE) ×1 IMPLANT
GLOVE BIOGEL PI INDICATOR 7.5 (GLOVE) ×2
GOWN STRL REUS W/TWL LRG LVL3 (GOWN DISPOSABLE) ×3 IMPLANT
GOWN STRL REUS W/TWL XL LVL3 (GOWN DISPOSABLE) ×6 IMPLANT
KIT BASIN OR (CUSTOM PROCEDURE TRAY) ×3 IMPLANT
NS IRRIG 1000ML POUR BTL (IV SOLUTION) ×3 IMPLANT
POUCH SPECIMEN RETRIEVAL 10MM (ENDOMECHANICALS) ×3 IMPLANT
RINGERS IRRIG 1000ML POUR BTL (IV SOLUTION) ×3 IMPLANT
SET CHOLANGIOGRAPH MIX (MISCELLANEOUS) ×3 IMPLANT
SET IRRIG TUBING LAPAROSCOPIC (IRRIGATION / IRRIGATOR) ×3 IMPLANT
SOLUTION ANTI FOG 6CC (MISCELLANEOUS) ×3 IMPLANT
STRIP CLOSURE SKIN 1/2X4 (GAUZE/BANDAGES/DRESSINGS) ×2 IMPLANT
SUT MNCRL AB 4-0 PS2 18 (SUTURE) ×3 IMPLANT
SYR 20CC LL (SYRINGE) IMPLANT
TOWEL OR 17X26 10 PK STRL BLUE (TOWEL DISPOSABLE) ×3 IMPLANT
TOWEL OR NON WOVEN STRL DISP B (DISPOSABLE) ×3 IMPLANT
TRAY LAP CHOLE (CUSTOM PROCEDURE TRAY) ×3 IMPLANT
TROCAR BLADELESS OPT 5 75 (ENDOMECHANICALS) ×6 IMPLANT
TROCAR XCEL BLUNT TIP 100MML (ENDOMECHANICALS) ×3 IMPLANT
TROCAR XCEL NON-BLD 11X100MML (ENDOMECHANICALS) ×3 IMPLANT
TUBING INSUFFLATION 10FT LAP (TUBING) ×3 IMPLANT

## 2013-12-01 NOTE — Discharge Instructions (Signed)
[facility] °LAPAROSCOPIC SURGERY: POST OP INSTRUCTIONS °Always review your discharge instruction sheet given to you by the facility where your surgery was performed. °IF YOU HAVE DISABILITY OR FAMILY LEAVE FORMS, YOU MUST BRING THEM TO THE OFFICE FOR PROCESSING.   °DO NOT GIVE THEM TO YOUR DOCTOR. ° °1. A prescription for pain medication will be given to you upon discharge.  Take your pain medication as prescribed, if needed.  If narcotic pain medicine is not needed, then you may take acetaminophen (Tylenol) or ibuprofen (Advil) as needed. °2. Take your usually prescribed medications unless otherwise directed. °3. If you need a refill on your pain medication, please contact your pharmacy.  They will contact our office to request authorization. Prescriptions will not be filled after 5pm or on week-ends. °4. You should follow a light diet the first few days after arrival home, such as soup and crackers, etc.  Be sure to include lots of fluids daily. °5. Most patients will experience some swelling and bruising in the area of the incisions.  Ice packs will help.  Swelling and bruising can take several days to resolve.  °6. It is common to experience some constipation if taking pain medication after surgery.  Increasing fluid intake and taking a stool softener (such as Colace) will usually help or prevent this problem from occurring.  A mild laxative (Milk of Magnesia or Miralax) should be taken according to package instructions if there are no bowel movements after 48 hours. °7. Unless discharge instructions indicate otherwise, you may remove your bandages 48 hours after surgery, and you may shower at that time.  You will have steri-strips (small skin tapes) in place directly over the incision.  These strips should be left on the skin for 7-10 days.  If your surgeon used skin glue on the incision, you may shower in 24 hours.  The glue will flake off over the next 2-3 weeks.  Any sutures or staples  will be removed at the office during your follow-up visit. °8. ACTIVITIES:  You may resume regular (light) daily activities beginning the next day--such as daily self-care, walking, climbing stairs--gradually increasing activities as tolerated.  You may have sexual intercourse when it is comfortable.  Refrain from any heavy lifting or straining until approved by your doctor. °a. You may drive when you are no longer taking prescription pain medication, you can comfortably wear a seatbelt, and you can safely maneuver your car and apply brakes. °b. RETURN TO WORK:   2-3 weeks °9. You should see your doctor in the office for a follow-up appointment approximately 2-3 weeks after your surgery.  Make sure that you call for this appointment within a day or two after you arrive home to insure a convenient appointment time. °10. OTHER INSTRUCTIONS: ________________________________________________________________________ °WHEN TO CALL YOUR DOCTOR: °1. Fever over 101.0 °2. Inability to urinate °3. Continued bleeding from incision. °4. Increased pain, redness, or drainage from the incision. °5. Increasing abdominal pain ° °The clinic staff is available to answer your questions during regular business hours.  Please don’t hesitate to call and ask to speak to one of the nurses for clinical concerns.  If you have a medical emergency, go to the nearest emergency room or call 911.  A surgeon from [facility] is always on call at the hospital. °[address], [address], [place], [place]  [address] ? [address] [address], [place], [place]   [address] °[phone] ? [phone] ? FAX [phone] °Web site:   [email or web address] ° °

## 2013-12-01 NOTE — Interval H&P Note (Signed)
History and Physical Interval Note:  12/01/2013 7:31 AM  Brian Beasley  has presented today for surgery, with the diagnosis of chronic cholecystitis  The various methods of treatment have been discussed with the patient and family. After consideration of risks, benefits and other options for treatment, the patient has consented to  Procedure(s): LAPAROSCOPIC CHOLECYSTECTOMY WITH INTRAOPERATIVE CHOLANGIOGRAM (N/A) as a surgical intervention .  The patient's history has been reviewed, patient examined, no change in status, stable for surgery.  I have reviewed the patient's chart and labs.  Questions were answered to the patient's satisfaction.     Drew Lips K.

## 2013-12-01 NOTE — Op Note (Signed)
Laparoscopic Cholecystectomy with IOC Procedure Note  Indications: This patient presents with symptomatic gallbladder disease and will undergo laparoscopic cholecystectomy.  Pre-operative Diagnosis: Chronic cholecystitis  Post-operative Diagnosis: Same  Surgeon: Judye Lorino K.   Assistants: none  Anesthesia: General endotracheal anesthesia  ASA Class: 2  Procedure Details  The patient was seen again in the Holding Room. The risks, benefits, complications, treatment options, and expected outcomes were discussed with the patient. The possibilities of reaction to medication, pulmonary aspiration, perforation of viscus, bleeding, recurrent infection, finding a normal gallbladder, the need for additional procedures, failure to diagnose a condition, the possible need to convert to an open procedure, and creating a complication requiring transfusion or operation were discussed with the patient. The likelihood of improving the patient's symptoms with return to their baseline status is good.  The patient and/or family concurred with the proposed plan, giving informed consent. The site of surgery properly noted. The patient was taken to Operating Room, identified as Brian Beasley and the procedure verified as Laparoscopic Cholecystectomy with Intraoperative Cholangiogram. A Time Out was held and the above information confirmed.  Prior to the induction of general anesthesia, antibiotic prophylaxis was administered. General endotracheal anesthesia was then administered and tolerated well. After the induction, the abdomen was prepped with Chloraprep and draped in the sterile fashion. The patient was positioned in the supine position.  Local anesthetic agent was injected into the skin near the umbilicus and an incision made. We dissected down to the abdominal fascia with blunt dissection.  The fascia was incised vertically and we entered the peritoneal cavity bluntly.  A pursestring suture of 0-Vicryl was  placed around the fascial opening.  The Hasson cannula was inserted and secured with the stay suture.  Pneumoperitoneum was then created with CO2 and tolerated well without any adverse changes in the patient's vital signs. An 11-mm port was placed in the subxiphoid position.  Two 5-mm ports were placed in the right upper quadrant. All skin incisions were infiltrated with a local anesthetic agent before making the incision and placing the trocars.   We positioned the patient in reverse Trendelenburg, tilted slightly to the patient's left.  The gallbladder was identified, the fundus grasped and retracted cephalad. Adhesions were lysed bluntly and with the electrocautery where indicated, taking care not to injure any adjacent organs or viscus. The infundibulum was grasped and retracted laterally, exposing the peritoneum overlying the triangle of Calot. This was then divided and exposed in a blunt fashion. A critical view of the cystic duct and cystic artery was obtained.  The cystic duct was clearly identified and bluntly dissected circumferentially. The cystic duct was ligated with a clip distally.   An incision was made in the cystic duct and the [facility] cholangiogram catheter introduced. The catheter was secured using a clip. A cholangiogram was then obtained which showed good visualization of the distal and proximal biliary tree with no sign of filling defects or obstruction.  Contrast flowed easily into the duodenum. The catheter was then removed.   The cystic duct was then ligated with clips and divided. The cystic artery was identified, dissected free, ligated with clips and divided as well.   The gallbladder was dissected from the liver bed in retrograde fashion with the electrocautery. The gallbladder was removed and placed in an Endocatch sac. The liver bed was irrigated and inspected. Hemostasis was achieved with the electrocautery. Copious irrigation was utilized and was repeatedly aspirated until clear.   The gallbladder and Endocatch sac were then  removed through the umbilical port site.  The pursestring suture was used to close the umbilical fascia.    We again inspected the right upper quadrant for hemostasis.  Pneumoperitoneum was released as we removed the trocars.  4-0 Monocryl was used to close the skin.   Benzoin, steri-strips, and clean dressings were applied. The patient was then extubated and brought to the recovery room in stable condition. Instrument, sponge, and needle counts were correct at closure and at the conclusion of the case.   Findings: Cholecystitis without Cholelithiasis  Estimated Blood Loss: Minimal         Drains: none         Specimens: Gallbladder           Complications: None; patient tolerated the procedure well.         Disposition: PACU - hemodynamically stable.         Condition: stable  Brian Beasley. Brian Dover, MD, [facility]  General/ Trauma Surgery  12/01/2013 8:53 AM

## 2013-12-01 NOTE — Preoperative (Signed)
Beta Blockers   Reason not to administer Beta Blockers:Not Applicable, not on home bb 

## 2013-12-01 NOTE — Anesthesia Postprocedure Evaluation (Signed)
  Anesthesia Post-op Note  Patient: Brian Beasley  Procedure(s) Performed: Procedure(s) (LRB): LAPAROSCOPIC CHOLECYSTECTOMY WITH INTRAOPERATIVE CHOLANGIOGRAM (N/A)  Patient Location: PACU  Anesthesia Type: General  Level of Consciousness: awake and alert   Airway and Oxygen Therapy: Patient Spontanous Breathing  Post-op Pain: mild  Post-op Assessment: Post-op Vital signs reviewed, Patient's Cardiovascular Status Stable, Respiratory Function Stable, Patent Airway and No signs of Nausea or vomiting  Last Vitals:  Filed Vitals:   12/01/13 0945  BP: 113/56  Pulse: 54  Temp:   Resp: 12    Post-op Vital Signs: stable   Complications: No apparent anesthesia complications

## 2013-12-01 NOTE — Transfer of Care (Signed)
Immediate Anesthesia Transfer of Care Note  Patient: Brian Beasley  Procedure(s) Performed: Procedure(s): LAPAROSCOPIC CHOLECYSTECTOMY WITH INTRAOPERATIVE CHOLANGIOGRAM (N/A)  Patient Location: PACU  Anesthesia Type:General  Level of Consciousness: Patient easily awoken, sedated, comfortable, cooperative, following commands, responds to stimulation.   Airway & Oxygen Therapy: Patient spontaneously breathing, ventilating well, oxygen via simple oxygen mask.  Post-op Assessment: Report given to PACU RN, vital signs reviewed and stable, moving all extremities.   Post vital signs: Reviewed and stable.  Complications: No apparent anesthesia complications

## 2013-12-01 NOTE — H&P (View-Only) (Signed)
Patient ID: Brian Beasley, male   DOB: May 20, 1961, 53 y.o.   MRN: [id number]  Chief Complaint  Patient presents with  . New Evaluation    eval GB    HPI Brian Beasley is a 53 y.o. male.  Referred by Dr. Darcus Austin for evaluation of gallbladder disease  HPI This is a 53 year old male who presents with a recent emergency department visit for acute epigastric and right upper quadrant abdominal pain. Initially he felt that this was chest pain see was evaluated and ruled out for cardiac disease. Ultrasound showed no sign of gallstones but he did have some sludge. His symptoms improved and he was discharged home with an outpatient HIDA scan. This showed a decreased gallbladder ejection fraction.  Upon further questioning, the patient admits to increased abdominal symptoms with more flatulence and belching. He notices that after eating greasy meals he gets some mild nausea as well as bloating. He does have some milder episodes of this right upper quadrant abdominal pain. He denies any jaundice. His bowel movements remain normal. Past Medical History  Diagnosis Date  . Cancer   . Hyperlipidemia   Basal cell carcinoma   Past Surgical History  Procedure Laterality Date  . Hand surgery      L hand, had 3 screws put in to repair bone  . Nasal sinus surgery      opened passages to improve drainage  . Basal cell carcinoma excision      right cheek    History reviewed. No pertinent family history.  Social History History  Substance Use Topics  . Smoking status: Never Smoker   . Smokeless tobacco: Never Used  . Alcohol Use: Yes     Comment: socially    No Known Allergies  Current Outpatient Prescriptions  Medication Sig Dispense Refill  . aspirin 81 MG chewable tablet Chew 81 mg by mouth daily.      . fexofenadine (ALLEGRA) 180 MG tablet Take 180 mg by mouth daily.      Marland Kitchen omeprazole (PRILOSEC OTC) 20 MG tablet Take 20 mg by mouth daily.      Marland Kitchen ZETIA 10 MG tablet Take 10 mg by mouth  daily.       . ondansetron (ZOFRAN) 8 MG tablet Take 1 tablet (8 mg total) by mouth every 8 (eight) hours as needed for nausea or vomiting.  20 tablet  0  . oxyCODONE-acetaminophen (PERCOCET) 5-325 MG per tablet Take 1 tablet by mouth every 4 (four) hours as needed for severe pain.  20 tablet  0   No current facility-administered medications for this visit.    Review of Systems Review of Systems  Constitutional: Negative for fever, chills and unexpected weight change.  HENT: Negative for congestion, hearing loss, sore throat, trouble swallowing and voice change.   Eyes: Negative for visual disturbance.  Respiratory: Negative for cough and wheezing.   Cardiovascular: Negative for chest pain, palpitations and leg swelling.  Gastrointestinal: Positive for nausea, abdominal pain and abdominal distention. Negative for vomiting, diarrhea, constipation, blood in stool, anal bleeding and rectal pain.  Genitourinary: Negative for hematuria and difficulty urinating.  Musculoskeletal: Negative for arthralgias.  Skin: Negative for rash and wound.  Neurological: Negative for seizures, syncope, weakness and headaches.  Hematological: Negative for adenopathy. Does not bruise/bleed easily.  Psychiatric/Behavioral: Negative for confusion.    Blood pressure 138/90, pulse 92, temperature 98 F (36.7 C), temperature source Temporal, resp. rate 15, height 6\' 1"  (1.854 m), weight 278 lb 9.6  oz (126.372 kg).  Physical Exam Physical Exam WDWN in NAD HEENT:  EOMI, sclera anicteric Neck:  No masses, no thyromegaly Lungs:  CTA bilaterally; normal respiratory effort CV:  Regular rate and rhythm; no murmurs Abd:  +bowel sounds, soft, non-tender, no masses Ext:  Well-perfused; no edema Skin:  Warm, dry; no sign of jaundice  Data Reviewed Dg Chest 2 View  11/05/2013   CLINICAL DATA:  Chest pain.  EXAM: CHEST  2 VIEW  COMPARISON:  None.  FINDINGS: Lung volumes are low but the lungs are clear. Heart size is  upper normal. No pneumothorax or pleural effusion.  IMPRESSION: No acute disease.   Electronically Signed   By: Inge Rise M.D.   On: 11/05/2013 04:39   US Abdomen Complete  11/05/2013   CLINICAL DATA:  Abdominal pain.  EXAM: ULTRASOUND ABDOMEN COMPLETE  COMPARISON:  None.  FINDINGS: Gallbladder:  Large amount of echogenic sludge within the gallbladder. No shadowing calculi. 5 mm polyp involving the neck of the gallbladder. No gallbladder wall thickening or pericholecystic fluid. Negative sonographic Murphy sign according to the ultrasound technologist.  Common bile duct:  Diameter: 4 mm.  Liver:  Diffusely increased and coarsened echotexture without focal hepatic parenchymal abnormality. Patent portal vein with hepatopetal flow.  IVC:  Normal.  Pancreas:  Not visualized due to midline bowel gas.  Spleen:  Normal size and echotexture without focal parenchymal abnormality.  Right Kidney:  Length: 12.4 cm. No hydronephrosis. Well-preserved cortex. No shadowing calculi. Normal parenchymal echotexture without focal abnormalities.  Left Kidney:  Length: 12.8 cm. No hydronephrosis. Well-preserved cortex. No shadowing calculi. Normal parenchymal echotexture without focal abnormalities.  Abdominal aorta:  Normal in caliber through its visualized course in the abdomen, obscured distally. Maximum diameter 2.3 cm.  Other findings:  None.  IMPRESSION: 1. Large amount of gallbladder sludge. No visible shadowing calculi. No sonographic evidence of cholecystitis. 2. 5 mm polyp in the gallbladder neck. 3. Diffuse hepatic steatosis and/or hepatocellular disease without focal hepatic parenchymal abnormality. 4. Nonvisualization of the pancreas due to midline bowel gas.   Electronically Signed   By: Evangeline Dakin M.D.   On: 11/05/2013 07:11   Nm Hepato W/eject Fract  11/07/2013   CLINICAL DATA:  Abdominal pain, right upper quadrant pain  EXAM: NUCLEAR MEDICINE HEPATOBILIARY IMAGING WITH GALLBLADDER EF  TECHNIQUE:  Sequential images of the abdomen were obtained out to 60 minutes following intravenous administration of radiopharmaceutical. After oral ingestion of Ensure, gallbladder ejection fraction was determined. At 60 min, normal ejection fraction is greater than 33%.  COMPARISON:  None.  RADIOPHARMACEUTICALS:  5.61mCi Tc-92m Choletec  FINDINGS: There is immediate homogeneous uptake of radiotracer in the liver. Filling of the gallbladder begins at 20 minutes. Radiotracer uptake is present in the small bowel at 30 minutes.  When gallbladder filling was complete, the patient was given PO Ensure. Gallbladder ejection fraction: 13.2 %. Normal gallbladder ejection fraction with Ensure is greater than 33%. The patient did not experience symptoms after oral ingestion of Ensure.  IMPRESSION: 1. No scintigraphic evidence of biliary obstruction. 2. Abnormal low gallbladder ejection fraction measuring 13.2% as can be seen with biliary dyskinesia.   Electronically Signed   By: Kathreen Devoid   On: 11/07/2013 14:16    Lab Results  Component Value Date   WBC 9.5 11/05/2013   HGB 15.1 11/05/2013   HCT 42.9 11/05/2013   MCV 92.1 11/05/2013   PLT 233 11/05/2013   Lab Results  Component Value Date   CREATININE  1.49* 11/05/2013   BUN 15 11/05/2013   NA 142 11/05/2013   K 4.1 11/05/2013   CL 102 11/05/2013   CO2 25 11/05/2013   Lab Results  Component Value Date   ALT 96* 11/05/2013   AST 96* 11/05/2013   ALKPHOS 90 11/05/2013   BILITOT 0.5 11/05/2013     Assessment    Biliary dyskinesia; chronic cholecystitis with sludge     Plan    Laparoscopic cholecystectomy with intraoperative cholangiogram.  The surgical procedure has been discussed with the patient.  Potential risks, benefits, alternative treatments, and expected outcomes have been explained.  All of the patient's questions at this time have been answered.  The likelihood of reaching the patient's treatment goal is good.  The patient understand the proposed surgical  procedure and wishes to proceed.         Aretta Stetzel K. 11/21/2013, 11:51 AM

## 2013-12-02 ENCOUNTER — Encounter (HOSPITAL_COMMUNITY): Payer: Self-pay | Admitting: Surgery

## 2013-12-26 ENCOUNTER — Ambulatory Visit (INDEPENDENT_AMBULATORY_CARE_PROVIDER_SITE_OTHER): Payer: BC Managed Care – PPO | Admitting: Surgery

## 2013-12-26 ENCOUNTER — Encounter (INDEPENDENT_AMBULATORY_CARE_PROVIDER_SITE_OTHER): Payer: Self-pay | Admitting: Surgery

## 2013-12-26 ENCOUNTER — Encounter: Payer: Self-pay | Admitting: General Surgery

## 2013-12-26 VITALS — BP 126/88 | HR 54 | Temp 98.2°F | Resp 16 | Ht 73.0 in | Wt 276.4 lb

## 2013-12-26 DIAGNOSIS — E669 Obesity, unspecified: Secondary | ICD-10-CM | POA: Insufficient documentation

## 2013-12-26 DIAGNOSIS — K811 Chronic cholecystitis: Secondary | ICD-10-CM

## 2013-12-26 DIAGNOSIS — G4733 Obstructive sleep apnea (adult) (pediatric): Secondary | ICD-10-CM | POA: Insufficient documentation

## 2013-12-26 DIAGNOSIS — R030 Elevated blood-pressure reading, without diagnosis of hypertension: Secondary | ICD-10-CM

## 2013-12-26 NOTE — Progress Notes (Signed)
Status post laparoscopic cholecystectomy with intraoperative cholangiogram on 12/01/13. His pathology showed chronic cholecystitis.  The patient is feeling quite well. No abdominal symptoms. He is having regular bowel movements with no diarrhea. Appetite is good. His incisions are all well-healed with no sign of infection.  He may resume full Activity. Followup as needed.  Brian Beasley. Georgette Dover, MD, [facility]  General/ Trauma Surgery  12/26/2013 1:47 PM

## 2014-06-19 ENCOUNTER — Other Ambulatory Visit (HOSPITAL_COMMUNITY): Payer: Self-pay | Admitting: Orthopedic Surgery

## 2014-06-19 ENCOUNTER — Ambulatory Visit (HOSPITAL_COMMUNITY)
Admission: RE | Admit: 2014-06-19 | Discharge: 2014-06-19 | Disposition: A | Discharge status: Home / Self Care | Payer: BC Managed Care – PPO | Source: Ambulatory Visit | Attending: Orthopedic Surgery | Admitting: Orthopedic Surgery

## 2014-06-19 DIAGNOSIS — Z01818 Encounter for other preprocedural examination: Secondary | ICD-10-CM

## 2014-06-19 DIAGNOSIS — Z0389 Encounter for observation for other suspected diseases and conditions ruled out: Secondary | ICD-10-CM | POA: Diagnosis present

## 2015-01-14 IMAGING — CR DG CHEST 2V
2 series · 2 of 2 positions shown · non-contrast
Comparison: None.

CLINICAL DATA: Chest pain.

EXAM:
CHEST  2 VIEW

[w chest pa *]
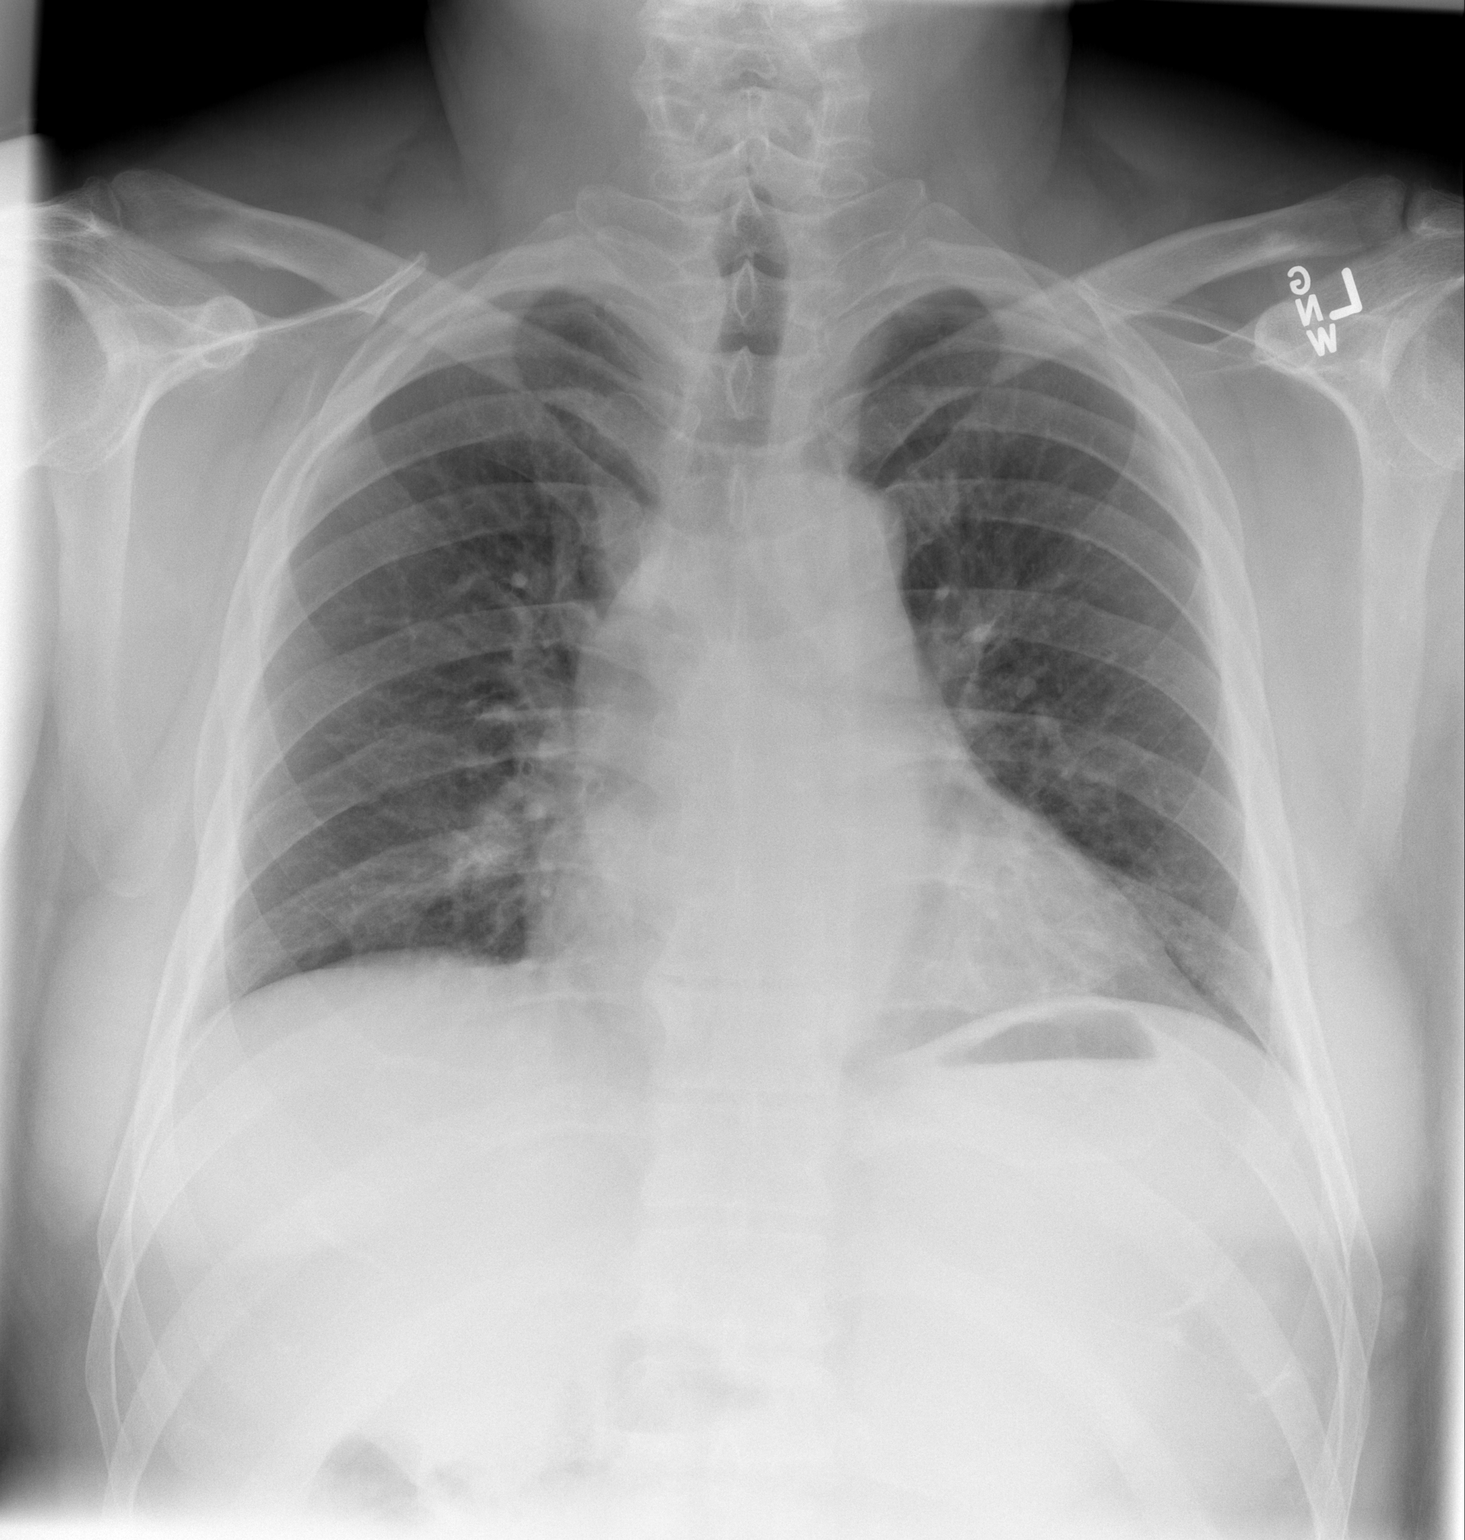

[w chest lat *]
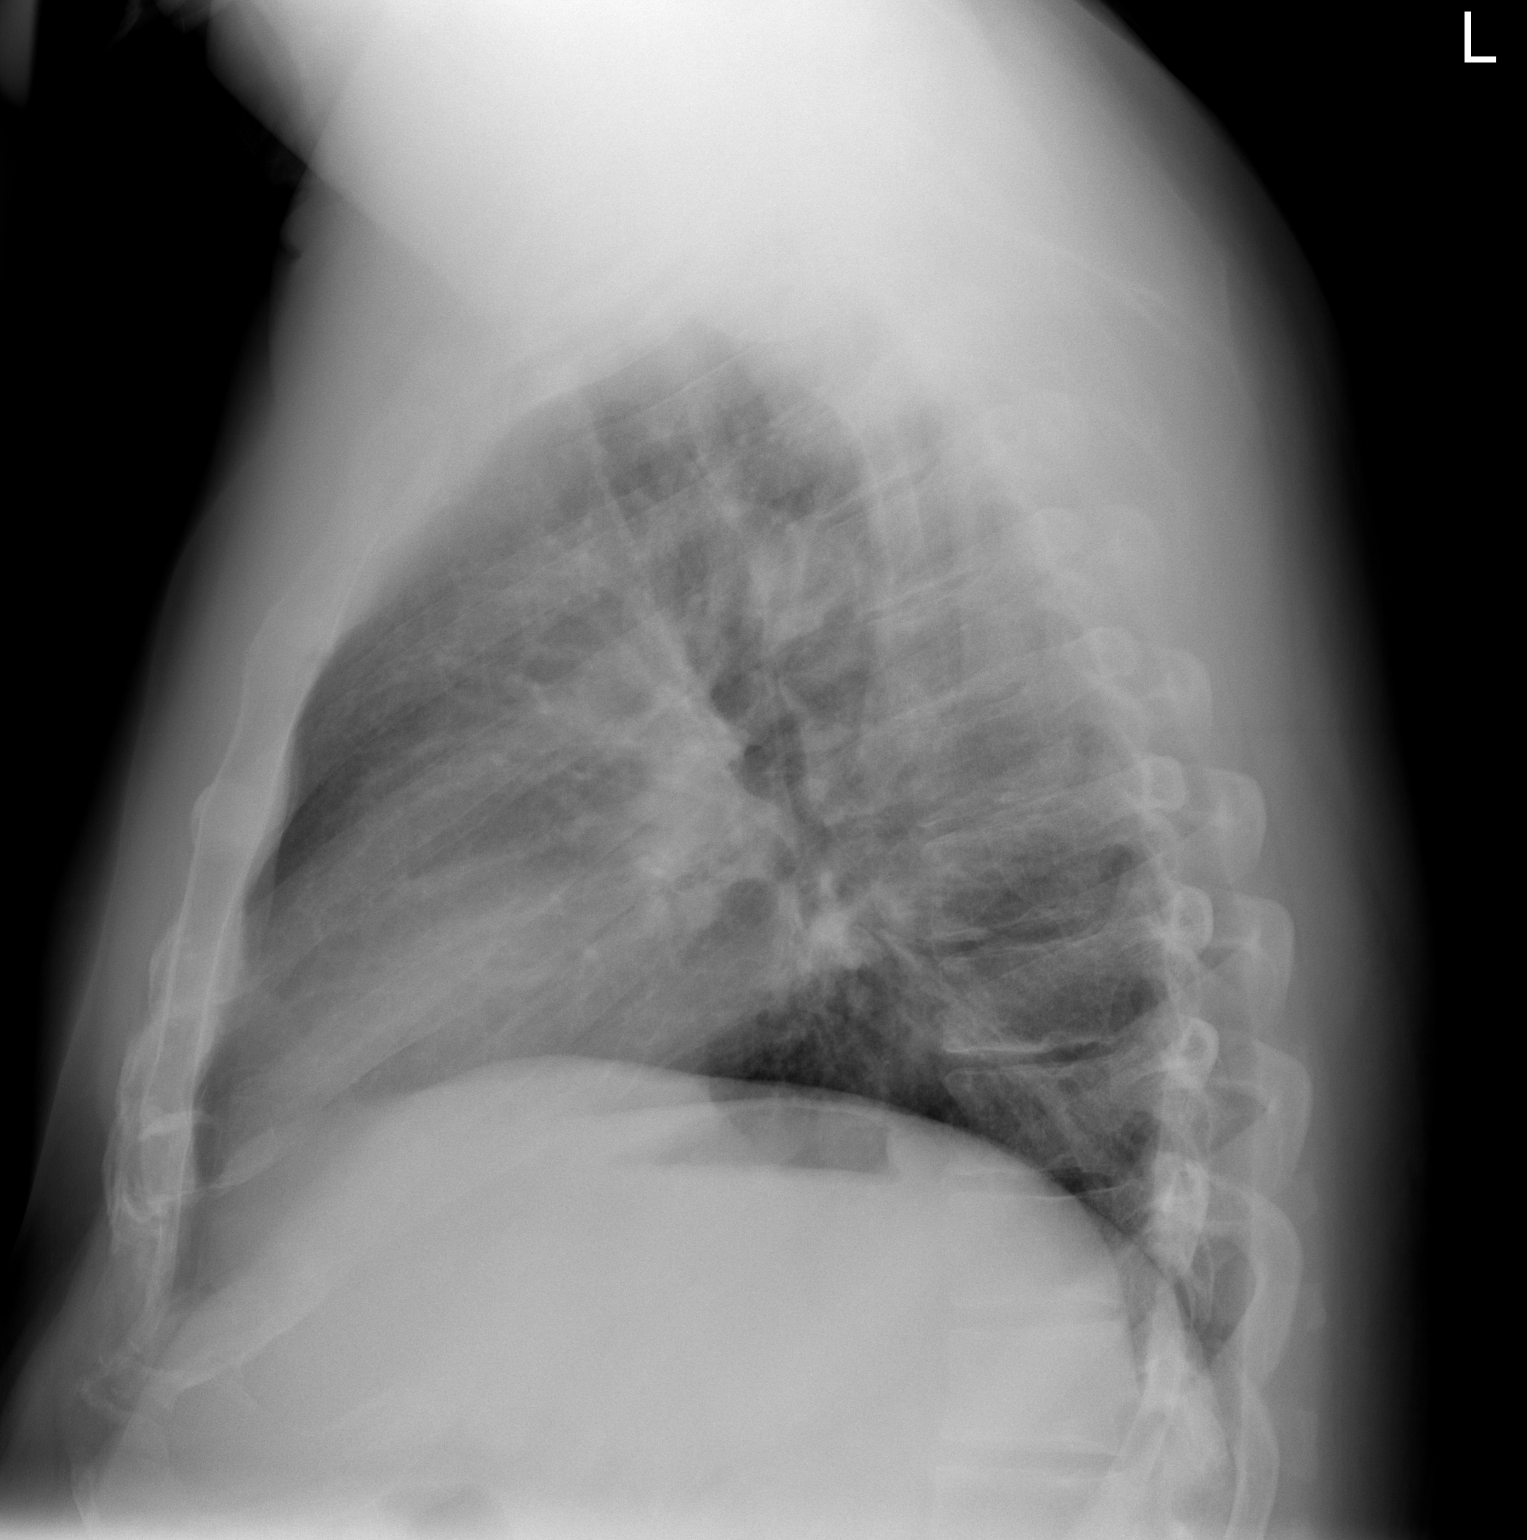

[2 of 2 positions shown; findings below may reference images not displayed]

FINDINGS: Lung volumes are low but the lungs are clear. Heart size is upper
normal. No pneumothorax or pleural effusion.
IMPRESSION: No acute disease.

## 2016-04-14 ENCOUNTER — Other Ambulatory Visit: Payer: Self-pay | Admitting: *Deleted

## 2016-04-14 ENCOUNTER — Telehealth: Payer: Self-pay | Admitting: Cardiology

## 2016-04-14 DIAGNOSIS — G4733 Obstructive sleep apnea (adult) (pediatric): Secondary | ICD-10-CM

## 2016-04-14 NOTE — Telephone Encounter (Signed)
Spoke with [facility] and Ivin Booty stated that in order for the patient to get a new machine, he has to have a face to face office visit stating that he is here for a replacement machine and that he is benefiting from a machine.   I do not have anyone that can see him before 6/21 since Radford Pax is not in the office.  Order has been placed for auto machine, and [facility] is going to call patient to see if he would like to rent a machine until he can be seen and an order placed for his replacement.

## 2016-04-14 NOTE — Telephone Encounter (Signed)
Pt's CPAP machine has broken-needs new one-called company to get it fixed and it's so old it's not made anymore-needs order --can't sleep and going out of town -needs asap- pls call [phone]

## 2016-04-15 NOTE — Telephone Encounter (Signed)
Received a call from [facility] that they are going to get the patient a rental machine for free until he can get in to see Dr. Radford Pax for a face to face office visit to get his official replacement machine.  I will check to see how quickly we can get him seen.

## 2016-05-01 ENCOUNTER — Ambulatory Visit (INDEPENDENT_AMBULATORY_CARE_PROVIDER_SITE_OTHER): Payer: BC Managed Care – PPO | Admitting: Cardiology

## 2016-05-01 ENCOUNTER — Encounter: Payer: Self-pay | Admitting: Cardiology

## 2016-05-01 ENCOUNTER — Encounter (INDEPENDENT_AMBULATORY_CARE_PROVIDER_SITE_OTHER): Payer: Self-pay

## 2016-05-01 VITALS — BP 132/70 | HR 64 | Ht 73.0 in | Wt 291.0 lb

## 2016-05-01 DIAGNOSIS — E669 Obesity, unspecified: Secondary | ICD-10-CM

## 2016-05-01 DIAGNOSIS — G4733 Obstructive sleep apnea (adult) (pediatric): Secondary | ICD-10-CM | POA: Diagnosis not present

## 2016-05-01 NOTE — Patient Instructions (Signed)
Medication Instructions:  Your physician recommends that you continue on your current medications as directed. Please refer to the Current Medication list given to you today.   Labwork: None  Testing/Procedures: Your physician has recommended that you have a sleep study. This test records several body functions during sleep, including: brain activity, eye movement, oxygen and carbon dioxide blood levels, heart rate and rhythm, breathing rate and rhythm, the flow of air through your mouth and nose, snoring, body muscle movements, and chest and belly movement.  Follow-Up: Your physician recommends that you schedule a follow-up appointment AS NEEDED with Dr. Turner pending study results.   Any Other Special Instructions Will Be Listed Below (If Applicable).     If you need a refill on your cardiac medications before your next appointment, please call your pharmacy.   

## 2016-05-01 NOTE — Progress Notes (Signed)
Cardiology Office Note    Date:  05/01/2016   ID:  WAH DILDINE, DOB 1961-07-30, MRN [id number]  PCP:  Marjorie Smolder, MD  Cardiologist:  Fransico Him, MD   Chief Complaint  Patient presents with  . Sleep Apnea    History of Present Illness:  COURAGE RUBY is a 55 y.o. male who presents today for evaluation of OSA.  He has a history of moderate OSA with an AHI of 26/hr by sleep study in 2010 and has been on CPAP but has not followed up with anyone in 5 years.  He tolerates his device well and feels the pressure is adequate but his machine recently stopped working and cannot be fixed and needs to have a new one.  He is currently is using a used machine.  He tolerates his full fackmask and has no mouth dryness but has some mild nasal congestion in the am.  He feels refreshed in the am but occasionally has some mild daytime sleepiness.      Past Medical History  Diagnosis Date  . Hyperlipidemia   . Cancer (HCC)     skin - basil cell  . GERD (gastroesophageal reflux disease)   . Allergic rhinitis   . Family history of colon cancer     Father  . OSA (obstructive sleep apnea)     PSG 10/2008 AHI 26.4/hr    Past Surgical History  Procedure Laterality Date  . Hand surgery      L hand, had 3 screws put in to repair bone  . Nasal sinus surgery      opened passages to improve drainage  . Basal cell carcinoma excision      right cheek  . Cholecystectomy N/A 12/01/2013    Procedure: LAPAROSCOPIC CHOLECYSTECTOMY WITH INTRAOPERATIVE CHOLANGIOGRAM;  Surgeon: Imogene Burn. Georgette Dover, MD;  Location: [facility];  Service: General;  Laterality: N/A;    Current Medications: Outpatient Prescriptions Prior to Visit  Medication Sig Dispense Refill  . aspirin 81 MG chewable tablet Chew 81 mg by mouth every morning.     Marland Kitchen omeprazole (PRILOSEC OTC) 20 MG tablet Take 20 mg by mouth daily.    Marland Kitchen ZETIA 10 MG tablet Take 10 mg by mouth every evening.     . fexofenadine (ALLEGRA) 180 MG tablet Take 180 mg  by mouth daily.    . ondansetron (ZOFRAN) 8 MG tablet Take 8 mg by mouth every 8 (eight) hours as needed for nausea or vomiting.    Marland Kitchen oxyCODONE-acetaminophen (PERCOCET/ROXICET) 5-325 MG per tablet Take 1 tablet by mouth every 4 (four) hours as needed for severe pain. 40 tablet 0   No facility-administered medications prior to visit.     Allergies:   Welchol and Zocor   Social History   Social History  . Marital Status: Married    Spouse Name: N/A  . Number of Children: N/A  . Years of Education: N/A   Social History Main Topics  . Smoking status: Never Smoker   . Smokeless tobacco: Current User    Types: Chew  . Alcohol Use: Yes     Comment: socially  . Drug Use: No  . Sexual Activity: Yes   Other Topics Concern  . None   Social History Narrative     Family History:  The patient's family history includes Colon cancer in his father; Diabetes in his mother; Heart disease in his mother.   ROS:   Please see the history of present  illness.    ROS All other systems reviewed and are negative.   PHYSICAL EXAM:   VS:  BP 132/70 mmHg  Pulse 64  Ht 6\' 1"  (1.854 m)  Wt 291 lb (131.997 kg)  BMI 38.40 kg/m2   GEN: Well nourished, well developed, in no acute distress HEENT: normal Neck: no JVD, carotid bruits, or masses Cardiac: RRR; no murmurs, rubs, or gallops,no edema.  Intact distal pulses bilaterally.  Respiratory:  clear to auscultation bilaterally, normal work of breathing GI: soft, nontender, nondistended, + BS MS: no deformity or atrophy Skin: warm and dry, no rash Neuro:  Alert and Oriented x 3, Strength and sensation are intact Psych: euthymic mood, full affect  Wt Readings from Last 3 Encounters:  05/01/16 291 lb (131.997 kg)  12/26/13 276 lb 6.4 oz (125.374 kg)  11/25/13 276 lb (125.193 kg)      Studies/Labs Reviewed:   EKG:  EKG is not ordered today.    Recent Labs: No results found for requested labs within last 365 days.   Lipid Panel No results  found for: CHOL, TRIG, HDL, CHOLHDL, VLDL, LDLCALC, LDLDIRECT  Additional studies/ records that were reviewed today include:  Old OV notes    ASSESSMENT:    1. OSA (obstructive sleep apnea)   2. Obesity      PLAN:  In order of problems listed above:  OSA - the patient is tolerating PAP therapy well without any problems.  The patient has been using and benefiting from CPAP use and will continue to benefit from therapy. He needs a new machine but has not had a sleep study in 8 years.  I will set him up for a split night study.   Obesity - I have encouraged him to get into a routine exercise program and cut back on carbs and portions.      Medication Adjustments/Labs and Tests Ordered: Current medicines are reviewed at length with the patient today.  Concerns regarding medicines are outlined above.  Medication changes, Labs and Tests ordered today are listed in the Patient Instructions below.  There are no Patient Instructions on file for this visit.   Signed, Fransico Him, MD  05/01/2016 10:54 AM    [facility] [place], [place], [place]  [address] Phone: [phone]; Fax: [phone]

## 2016-05-02 ENCOUNTER — Other Ambulatory Visit: Payer: Self-pay

## 2016-05-20 ENCOUNTER — Ambulatory Visit (HOSPITAL_BASED_OUTPATIENT_CLINIC_OR_DEPARTMENT_OTHER): Payer: BC Managed Care – PPO | Attending: Cardiology | Admitting: Cardiology

## 2016-05-20 VITALS — Ht 73.0 in | Wt 285.0 lb

## 2016-05-20 DIAGNOSIS — R0683 Snoring: Secondary | ICD-10-CM | POA: Diagnosis not present

## 2016-05-20 DIAGNOSIS — I493 Ventricular premature depolarization: Secondary | ICD-10-CM | POA: Insufficient documentation

## 2016-05-20 DIAGNOSIS — G4733 Obstructive sleep apnea (adult) (pediatric): Secondary | ICD-10-CM | POA: Insufficient documentation

## 2016-05-30 NOTE — Procedures (Signed)
   Patient Name: Brian Beasley, Brian Beasley Date: 05/20/2016 Gender: Male D.O.B: 10-05-1961 Age (years): 33 Referring Provider: Fransico Him MD, ABSM Height (inches): 25 Interpreting Physician: Fransico Him MD, ABSM Weight (lbs): 285 RPSGT: Zadie Rhine BMI: 38 MRN: [id number] Neck Size: 20.00  CLINICAL INFORMATION Sleep Study Type: NPSG Indication for sleep study: OSA Epworth Sleepiness Score: 9 Most recent polysomnogram dated 07/07/2014 revealed an AHI of 26.40/h and RDI of 31.7/h.  SLEEP STUDY TECHNIQUE As per the AASM Manual for the Scoring of Sleep and Associated Events v2.3 (April 2016) with a hypopnea requiring 4% desaturations. The channels recorded and monitored were frontal, central and occipital EEG, electrooculogram (EOG), submentalis EMG (chin), nasal and oral airflow, thoracic and abdominal wall motion, anterior tibialis EMG, snore microphone, electrocardiogram, and pulse oximetry.  MEDICATIONS Patient's medications include: Reviewed in the chart. Medications self-administered by patient during sleep study : No sleep medicine administered.  SLEEP ARCHITECTURE The study was initiated at 9:46:08 PM and ended at 4:05:45 AM. Sleep onset time was 17.4 minutes and the sleep efficiency was reduced at 44.3%. The total sleep time was 168.0 minutes. Stage REM latency was markedly reduced at 280.5 minutes. The patient spent 23.51% of the night in stage N1 sleep, 67.26% in stage N2 sleep, 0.00% in stage N3 and 9.23% in REM. Alpha intrusion was absent. Supine sleep was 36.31%.  RESPIRATORY PARAMETERS The overall apnea/hypopnea index (AHI) was 40.4 per hour. There were 85 total apneas, including 81 obstructive, 1 central and 3 mixed apneas. There were 28 hypopneas and 58 RERAs. The AHI during Stage REM sleep was 69.7 per hour. AHI while supine was 46.2 per hour. The mean oxygen saturation was 93.94%. The minimum SpO2 during sleep was 86.00%. Moderate snoring was noted during this  study.  CARDIAC DATA The 2 lead EKG demonstrated sinus rhythm. The mean heart rate was 52.89 beats per minute. Other EKG findings include: PVCs.  LEG MOVEMENT DATA The total PLMS were 0 with a resulting PLMS index of 0.00. Associated arousal with leg movement index was 0.0 .  IMPRESSIONS - Severe obstructive sleep apnea occurred during this study (AHI = 40.4/h). - No significant central sleep apnea occurred during this study (CAI = 0.4/h). - Mild oxygen desaturation was noted during this study (Min O2 = 86.00%). - The patient snored with Moderate snoring volume. - EKG findings include PVCs. - Clinically significant periodic limb movements did not occur during sleep. No significant associated arousals.  DIAGNOSIS - Obstructive Sleep Apnea (327.23 [G47.33 ICD-10])  RECOMMENDATIONS - Therapeutic CPAP titration to determine optimal pressure required to alleviate sleep disordered breathing. - Avoid alcohol, sedatives and other CNS depressants that may worsen sleep apnea and disrupt normal sleep architecture. - Sleep hygiene should be reviewed to assess factors that may improve sleep quality. - Weight management and regular exercise should be initiated or continued if appropriate.   [place], American Board of Sleep Medicine  ELECTRONICALLY SIGNED ON:  05/30/2016, 3:45 PM [place] PH: [phone]   FX: [phone] [place]

## 2016-06-03 ENCOUNTER — Other Ambulatory Visit: Payer: Self-pay

## 2016-06-03 DIAGNOSIS — G4733 Obstructive sleep apnea (adult) (pediatric): Secondary | ICD-10-CM

## 2016-06-03 NOTE — Progress Notes (Signed)
Spoke with patient and gave sleep study results and Dr.Turner's recommendations. Patient verbalized understanding and is ok with going back to the sleep lab for a titration. Will set up appt and mail letter. Let patient know if appt date doesn't work with his schedule, he will need to call the number on his paper and reschedule. Patient verbalized understanding.

## 2016-06-19 ENCOUNTER — Encounter (HOSPITAL_BASED_OUTPATIENT_CLINIC_OR_DEPARTMENT_OTHER): Payer: BC Managed Care – PPO

## 2016-07-01 ENCOUNTER — Ambulatory Visit (HOSPITAL_BASED_OUTPATIENT_CLINIC_OR_DEPARTMENT_OTHER): Payer: BC Managed Care – PPO | Attending: Cardiology | Admitting: Cardiology

## 2016-07-01 DIAGNOSIS — I493 Ventricular premature depolarization: Secondary | ICD-10-CM | POA: Insufficient documentation

## 2016-07-01 DIAGNOSIS — Z79899 Other long term (current) drug therapy: Secondary | ICD-10-CM | POA: Diagnosis not present

## 2016-07-01 DIAGNOSIS — Z7982 Long term (current) use of aspirin: Secondary | ICD-10-CM | POA: Diagnosis not present

## 2016-07-01 DIAGNOSIS — G4733 Obstructive sleep apnea (adult) (pediatric): Secondary | ICD-10-CM

## 2016-07-02 NOTE — Procedures (Signed)
   Patient Name: Brian Beasley, Brian Beasley Date: 07/01/2016 Gender: Male D.O.B: [birth year] Age (years): 70 Referring Provider: Fransico Him MD, ABSM Height (inches): 73 Interpreting Physician: Fransico Him MD, ABSM Weight (lbs): 285 RPSGT: Laren Everts BMI: 38 MRN: [id number] Neck Size: 20.00  CLINICAL INFORMATION The patient is referred for a CPAP titration to treat sleep apnea.   SLEEP STUDY TECHNIQUE As per the AASM Manual for the Scoring of Sleep and Associated Events v2.3 (April 2016) with a hypopnea requiring 4% desaturations. The channels recorded and monitored were frontal, central and occipital EEG, electrooculogram (EOG), submentalis EMG (chin), nasal and oral airflow, thoracic and abdominal wall motion, anterior tibialis EMG, snore microphone, electrocardiogram, and pulse oximetry. Continuous positive airway pressure (CPAP) was initiated at the beginning of the study and titrated to treat sleep-disordered breathing.  MEDICATIONS Medications taken by the patient : OMEPRAZOLE, ASPIRIN, ZETIA, [facility]  Medications administered by patient during sleep study : No sleep medicine administered.  TECHNICIAN COMMENTS Comments added by technician: NONE  Comments added by scorer: N/A  RESPIRATORY PARAMETERS Optimal PAP Pressure (cm): 13  AHI at Optimal Pressure (/hr):0.0 Overall Minimal O2 (%):88.00  Supine % at Optimal Pressure (%):13 Minimal O2 at Optimal Pressure (%): 93.0    SLEEP ARCHITECTURE The study was initiated at 9:29:01 PM and ended at 4:33:44 AM. Sleep onset time was 14.4 minutes and the sleep efficiency was 83.3%. The total sleep time was 354.0 minutes. The patient spent 18.93% of the night in stage N1 sleep, 69.21% in stage N2 sleep, 0.00% in stage N3 and 11.86% in REM.Stage REM latency was 86.0 minutes Wake after sleep onset was 56.3. Alpha intrusion was absent. Supine sleep was 38.84%.  CARDIAC DATA The 2 lead EKG demonstrated sinus rhythm. The  mean heart rate was 54.59 beats per minute. Other EKG findings include: PVCs.  LEG MOVEMENT DATA The total Periodic Limb Movements of Sleep (PLMS) were 2. The PLMS index was 0.34. A PLMS index of <15 is considered normal in adults.  IMPRESSIONS - The optimal PAP pressure was 13 cm of water. - Central sleep apnea was not noted during this titration (CAI = 0.0/h). - Mild oxygen desaturations were observed during this titration (min O2 = 88.00%). - No snoring was audible during this study. - 2-lead EKG demonstrated: PVCs - Clinically significant periodic limb movements were not noted during this study. Arousals associated with PLMs were rare.  DIAGNOSIS - Obstructive Sleep Apnea (327.23 [G47.33 ICD-10])  RECOMMENDATIONS - Trial of CPAP therapy on 13 cm H2O with a Medium size Resmed Full Face Mask AirFit F20 mask and heated humidification. - Avoid alcohol, sedatives and other CNS depressants that may worsen sleep apnea and disrupt normal sleep architecture. - Sleep hygiene should be reviewed to assess factors that may improve sleep quality. - Weight management and regular exercise should be initiated or continued. - Return to Sleep Center for re-evaluation after 10 weeks of therapy   [place], [place] [facility] [employer] [facility]  ELECTRONICALLY SIGNED ON:  07/02/2016, 9:42 PM [place] PH: [phone]   FX: [phone] [place]

## 2016-07-04 NOTE — Progress Notes (Signed)
07/04/16 Called patient to give titration results. VM has not been set up yet. Will try again next week.

## 2016-07-07 NOTE — Progress Notes (Signed)
07/07/16  Spoke with patient and advised him of cpap titration results. Let him know orders will be sent to [facility] and they will get him set up with a machine. After we receive confirmation that he has the machine in his home and using it, we will set him up with an appt with Dr.Turner for follow up. Patient had no questions. Encouraged to call if he should think of any or have any concerns.

## 2016-07-15 ENCOUNTER — Telehealth: Payer: Self-pay | Admitting: *Deleted

## 2016-07-15 NOTE — Telephone Encounter (Signed)
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   Patient was set up with the wrong company after his recent sleep study.   Orders should have been sent to [facility] and not [facility].    [facility] realizes this was a mistake and they called and said that patient needs to just turn in the machine back to [facility] and keep his paperwork, and orders will be sent to [facility].  Patient is aware.

## 2016-07-15 NOTE — Telephone Encounter (Signed)
[facility] has requested that patient just stay with [facility] since he has already been set up with them.  Ivin Booty stated that insurance would deny their claim if they try to take over, so it's best to just leave him where he is.     They were going to call him and explain all that to him, and tell him to keep his equipment from [facility].        [facility] is also aware that patient will be keeping machine.      All parties involved know to call me if they need any additional information.

## 2016-08-01 ENCOUNTER — Encounter: Payer: Self-pay | Admitting: *Deleted

## 2016-08-21 ENCOUNTER — Encounter: Payer: Self-pay | Admitting: *Deleted

## 2016-09-11 ENCOUNTER — Telehealth: Payer: Self-pay | Admitting: *Deleted

## 2016-09-11 NOTE — Telephone Encounter (Signed)
Patient called in saying he received a letter to schedule an appointment for early December after his CPAP set-up. Dr Radford Pax had no appt. slots for December. Butch Penny in scheduling will schedule the patient asap

## 2016-10-05 ENCOUNTER — Encounter: Payer: Self-pay | Admitting: Cardiology

## 2016-10-07 ENCOUNTER — Encounter: Payer: Self-pay | Admitting: Cardiology

## 2016-10-07 ENCOUNTER — Ambulatory Visit (INDEPENDENT_AMBULATORY_CARE_PROVIDER_SITE_OTHER): Payer: BC Managed Care – PPO | Admitting: Cardiology

## 2016-10-07 VITALS — BP 150/90 | HR 70 | Ht 73.0 in | Wt 291.1 lb

## 2016-10-07 DIAGNOSIS — E6609 Other obesity due to excess calories: Secondary | ICD-10-CM

## 2016-10-07 DIAGNOSIS — G4733 Obstructive sleep apnea (adult) (pediatric): Secondary | ICD-10-CM

## 2016-10-07 NOTE — Progress Notes (Signed)
Cardiology Office Note    Date:  10/07/2016   ID:  Brian Beasley, DOB 1961-01-18, MRN [id number]  PCP:  Marjorie Smolder, MD  Cardiologist:  Fransico Him, MD   Chief Complaint  Patient presents with  . Sleep Apnea    History of Present Illness:  Brian Beasley is a 55 y.o. male who presents today for followup of OSA.  He has a history of moderate OSA and is on CPAP.  He tolerates his device.  He tolerates his full face mask and has no significant mouth dryness but has some mild nasal congestion in the am.  For the most part he feels refreshed in the am but occasionally has some mild daytime sleepiness if he is watching TV.       Past Medical History:  Diagnosis Date  . Allergic rhinitis   . Cancer (HCC)    skin - basil cell  . Family history of colon cancer    Father  . GERD (gastroesophageal reflux disease)   . Hyperlipidemia   . OSA (obstructive sleep apnea)    PSG 10/2008 AHI 26.4/hr    Past Surgical History:  Procedure Laterality Date  . BASAL CELL CARCINOMA EXCISION     right cheek  . CHOLECYSTECTOMY N/A 12/01/2013   Procedure: LAPAROSCOPIC CHOLECYSTECTOMY WITH INTRAOPERATIVE CHOLANGIOGRAM;  Surgeon: Imogene Burn. Tsuei, MD;  Location: [facility];  Service: General;  Laterality: N/A;  . HAND SURGERY     L hand, had 3 screws put in to repair bone  . NASAL SINUS SURGERY     opened passages to improve drainage    Current Medications: Outpatient Medications Prior to Visit  Medication Sig Dispense Refill  . aspirin 81 MG chewable tablet Chew 81 mg by mouth every morning.     Marland Kitchen omeprazole (PRILOSEC OTC) 20 MG tablet Take 20 mg by mouth daily.    Marland Kitchen ZETIA 10 MG tablet Take 10 mg by mouth every evening.      No facility-administered medications prior to visit.      Allergies:   Welchol [colesevelam hcl] and Zocor [simvastatin]   Social History   Social History  . Marital status: Married    Spouse name: N/A  . Number of children: N/A  . Years of education: N/A    Social History Main Topics  . Smoking status: Never Smoker  . Smokeless tobacco: Current User    Types: Chew  . Alcohol use Yes     Comment: socially  . Drug use: No  . Sexual activity: Yes   Other Topics Concern  . None   Social History Narrative  . None     Family History:  The patient's family history includes Colon cancer in his father; Diabetes in his mother; Heart disease in his mother.   ROS:   Please see the history of present illness.    ROS All other systems reviewed and are negative.  No flowsheet data found.     PHYSICAL EXAM:   VS:  BP (!) 150/90 (BP Location: Right Arm)   Pulse 70   Ht 6\' 1"  (1.854 m)   Wt 291 lb 1.9 oz (132.1 kg)   BMI 38.41 kg/m    GEN: Well nourished, well developed, in no acute distress  HEENT: normal  Neck: no JVD, carotid bruits, or masses Cardiac: RRR; no murmurs, rubs, or gallops,no edema.  Intact distal pulses bilaterally.  Respiratory:  clear to auscultation bilaterally, normal work of breathing GI: soft, nontender,  nondistended, + BS MS: no deformity or atrophy  Skin: warm and dry, no rash Neuro:  Alert and Oriented x 3, Strength and sensation are intact Psych: euthymic mood, full affect  Wt Readings from Last 3 Encounters:  10/07/16 291 lb 1.9 oz (132.1 kg)  07/01/16 285 lb (129.3 kg)  05/20/16 285 lb (129.3 kg)      Studies/Labs Reviewed:   EKG:  EKG is not ordered today.   Recent Labs: No results found for requested labs within last 8760 hours.   Lipid Panel No results found for: CHOL, TRIG, HDL, CHOLHDL, VLDL, LDLCALC, LDLDIRECT  Additional studies/ records that were reviewed today include:  CPAP download    ASSESSMENT:    1. OSA (obstructive sleep apnea)   2. Class 1 obesity due to excess calories with serious comorbidity in adult, unspecified BMI      PLAN:  In order of problems listed above:  OSA - the patient is tolerating PAP therapy well without any problems. The PAP download was  reviewed today and showed an AHI of 0.8/hr on 13 cm H2O with 100% compliance in using more than 4 hours nightly.  The patient has been using and benefiting from CPAP use and will continue to benefit from therapy. I recommended that he avoid ETOH use 3 hours prior to sleep.  He will continue to avoid sleeping supine.  Obesity - I have encouraged him to get into a routine exercise program and cut back on carbs and portions.      Medication Adjustments/Labs and Tests Ordered: Current medicines are reviewed at length with the patient today.  Concerns regarding medicines are outlined above.  Medication changes, Labs and Tests ordered today are listed in the Patient Instructions below.  There are no Patient Instructions on file for this visit.   Signed, Fransico Him, MD  10/07/2016 4:08 PM    [facility] [address], [place], [place]  [address] Phone: [phone]; Fax: [phone]

## 2016-10-07 NOTE — Patient Instructions (Signed)

## 2019-08-05 LAB — NOVEL CORONAVIRUS, NAA: SARS-CoV-2, NAA: NOT DETECTED

## 2019-08-12 ENCOUNTER — Other Ambulatory Visit: Payer: Self-pay | Admitting: *Deleted

## 2019-08-12 DIAGNOSIS — Z20822 Contact with and (suspected) exposure to covid-19: Secondary | ICD-10-CM

## 2019-11-01 ENCOUNTER — Other Ambulatory Visit: Payer: Self-pay

## 2019-11-01 ENCOUNTER — Telehealth: Payer: Self-pay | Admitting: *Deleted

## 2019-11-01 ENCOUNTER — Telehealth (INDEPENDENT_AMBULATORY_CARE_PROVIDER_SITE_OTHER): Payer: BC Managed Care – PPO | Admitting: Cardiology

## 2019-11-01 VITALS — Ht 73.0 in | Wt 260.0 lb

## 2019-11-01 DIAGNOSIS — G4733 Obstructive sleep apnea (adult) (pediatric): Secondary | ICD-10-CM

## 2019-11-01 NOTE — Telephone Encounter (Signed)
-----   Message from Sueanne Margarita, MD sent at 11/01/2019  3:34 PM EST ----- Order PAP supplies.  Followup with me in 1 year

## 2019-11-01 NOTE — Telephone Encounter (Signed)
cpap supplies order placed to [facility] via community message. 1 year follow up made.

## 2019-11-01 NOTE — Progress Notes (Signed)
Virtual Visit via Telephone Note   This visit type was conducted due to national recommendations for restrictions regarding the COVID-19 Pandemic (e.g. social distancing) in an effort to limit this patient's exposure and mitigate transmission in our community.  Due to his co-morbid illnesses, this patient is at least at moderate risk for complications without adequate follow up.  This format is felt to be most appropriate for this patient at this time.  The patient did not have access to video technology/had technical difficulties with video requiring transitioning to audio format only (telephone).  All issues noted in this document were discussed and addressed.  No physical exam could be performed with this format.  Please refer to the patient's chart for his  consent to telehealth for [facility].   Evaluation Performed:  Cardiology Consult  This visit type was conducted due to national recommendations for restrictions regarding the COVID-19 Pandemic (e.g. social distancing).  This format is felt to be most appropriate for this patient at this time.  All issues noted in this document were discussed and addressed.  No physical exam was performed (except for noted visual exam findings with Video Visits).  Please refer to the patient's chart (MyChart message for video visits and phone note for telephone visits) for the patient's consent to telehealth for [facility].  Date:  11/01/2019   ID:  Brian Beasley, DOB [birth year], MRN [id number]  Patient Location:  Home  Provider location:   [place]  PCP:  Darcus Austin, MD (Inactive)  Sleep medicine:  Fransico Him, MD Electrophysiologist:  None   Chief Complaint:  OSA  History of Present Illness:    Brian Beasley is a 59 y.o. male who presents via audio/video conferencing for a telehealth visit today in referral by his PCP for evaluation of OSA.    He is here for followup. Unfortunately he tested positive for COVID 3 weeks ago.  Fortunately he never got symptoms and then got retested and test was negative. He received his first shot of the vaccine today as he works at a retirement community.  He is doing well with his CPAP device. He tolerates the mask and feels the pressure is adequate.  Since going on CPAP he feels rested in the am and has no significant daytime sleepiness.  He has some mild mouth and nose dryness and has chronic nasal congestion due to sinus issues in the past and uses a saline sinus rinse nightly and flonase  He does not think that he snores.    The patient does not have symptoms concerning for COVID-19 infection (fever, chills, cough, or new shortness of breath).   Prior CV studies:   The following studies were reviewed today:  PAP compliance download  Past Medical History:  Diagnosis Date  . Allergic rhinitis   . Cancer (HCC)    skin - basil cell  . Family history of colon cancer    Father  . GERD (gastroesophageal reflux disease)   . Hyperlipidemia   . OSA (obstructive sleep apnea)    PSG 10/2008 AHI 26.4/hr   Past Surgical History:  Procedure Laterality Date  . BASAL CELL CARCINOMA EXCISION     right cheek  . CHOLECYSTECTOMY N/A 12/01/2013   Procedure: LAPAROSCOPIC CHOLECYSTECTOMY WITH INTRAOPERATIVE CHOLANGIOGRAM;  Surgeon: Imogene Burn. Tsuei, MD;  Location: [facility];  Service: General;  Laterality: N/A;  . HAND SURGERY     L hand, had 3 screws put in to repair bone  . NASAL SINUS  SURGERY     opened passages to improve drainage     Current Meds  Medication Sig  . aspirin 81 MG chewable tablet Chew 81 mg by mouth every morning.   . fluticasone (FLONASE) 50 MCG/ACT nasal spray Place 2 sprays into both nostrils daily as needed for allergies.  Marland Kitchen losartan (COZAAR) 50 MG tablet Take 50 mg by mouth daily.  Marland Kitchen omeprazole (PRILOSEC OTC) 20 MG tablet Take 20 mg by mouth daily.  Marland Kitchen ZETIA 10 MG tablet Take 10 mg by mouth every evening.      Allergies:   Welchol [colesevelam hcl] and Zocor  [simvastatin]   Social History   Tobacco Use  . Smoking status: Never Smoker  . Smokeless tobacco: Current User    Types: Chew  Substance Use Topics  . Alcohol use: Yes    Comment: socially  . Drug use: No     Family Hx: The patient's family history includes Colon cancer in his father; Diabetes in his mother; Heart disease in his mother.  ROS:   Please see the history of present illness.     All other systems reviewed and are negative.   Labs/Other Tests and Data Reviewed:    Recent Labs: No results found for requested labs within last 8760 hours.   Recent Lipid Panel No results found for: CHOL, TRIG, HDL, CHOLHDL, LDLCALC, LDLDIRECT  Wt Readings from Last 3 Encounters:  11/01/19 260 lb (117.9 kg)  10/07/16 291 lb 1.9 oz (132.1 kg)  07/01/16 285 lb (129.3 kg)     Objective:    Vital Signs:  Ht 6\' 1"  (1.854 m)   Wt 260 lb (117.9 kg)   BMI 34.30 kg/m    ASSESSMENT & PLAN:    1.  OSA -  The patient is tolerating PAP therapy well without any problems. The PAP download was reviewed today and showed an AHI of 1/hr on 13 cm H2O with 100% compliance in using more than 4 hours nightly.  The patient has been using and benefiting from PAP use and will continue to benefit from therapy.    2.  Obesity -he has lost 25-30lbs in the past 4 months -he has been eating less and busy working so less snacking  COVID-19 Education: The signs and symptoms of COVID-19 were discussed with the patient and how to seek care for testing (follow up with PCP or arrange E-visit).  The importance of social distancing was discussed today.  Patient Risk:   After full review of this patient's clinical status, I feel that they are at least moderate risk at this time.  Time:   Today, I have spent 20 minutes directly with the patient on telemedicine discussing medical problems including OSA and obesity.  We also reviewed the symptoms of COVID 19 and the ways to protect against contracting the  virus with telehealth technology.  I spent an additional 5 minutes reviewing patient's chart including PAP compliance download.  Medication Adjustments/Labs and Tests Ordered: Current medicines are reviewed at length with the patient today.  Concerns regarding medicines are outlined above.  Tests Ordered: No orders of the defined types were placed in this encounter.  Medication Changes: No orders of the defined types were placed in this encounter.   Disposition:  Follow up in 1 year(s)  Signed, Fransico Him, MD  11/01/2019 3:37 PM    [facility]

## 2020-10-01 ENCOUNTER — Other Ambulatory Visit: Payer: Self-pay | Admitting: Family Medicine

## 2020-10-01 DIAGNOSIS — R7401 Elevation of levels of liver transaminase levels: Secondary | ICD-10-CM

## 2022-01-10 DIAGNOSIS — G4733 Obstructive sleep apnea (adult) (pediatric): Secondary | ICD-10-CM

## 2022-01-10 NOTE — Telephone Encounter (Signed)
Patient needs new Rx for supplies I encouraged him to call and make an appointment to see you since he was last seen on 11-01-19. Pt agreed. I will send his Rx to [facility] is this ok? ?

## 2022-01-10 NOTE — Telephone Encounter (Signed)
Pt calling and leaving message asking Dr. Radford Pax to send in a new Rx for his C-PAP for [place]. Pt would like a call back concerning this matter at [phone]. Please address ?

## 2022-01-13 NOTE — Telephone Encounter (Signed)
Error. Staff message sent to Gae Bon per her request.  ?

## 2022-02-05 ENCOUNTER — Telehealth: Payer: Self-pay | Admitting: Gastroenterology

## 2022-02-05 NOTE — Telephone Encounter (Signed)
Good Afternoon Dr. Fuller Plan ? ? ?D.O.D for 3/30 AM. ? ? ?Patients wife called stating that patient had a referral in for a colonoscopy and that he had his records sent over from [place] from his last procedure 3 years ago. We have received patients records and I will be sending them up for you to review. Will you please advise on scheduling?   ?

## 2022-02-12 ENCOUNTER — Encounter: Payer: Self-pay | Admitting: Gastroenterology

## 2022-02-12 NOTE — Telephone Encounter (Signed)
Patient was scheduled for 6/13 with Dr. Fuller Plan.  ?

## 2022-03-11 ENCOUNTER — Ambulatory Visit (AMBULATORY_SURGERY_CENTER): Payer: Self-pay

## 2022-03-11 ENCOUNTER — Other Ambulatory Visit: Payer: Self-pay

## 2022-03-11 VITALS — Ht 73.0 in | Wt 260.0 lb

## 2022-03-11 DIAGNOSIS — Z800 Family history of malignant neoplasm of digestive organs: Secondary | ICD-10-CM

## 2022-03-11 MED ORDER — PEG 3350-KCL-NA BICARB-NACL 420 G PO SOLR: 4000.0000 mL | Freq: Once | ORAL | 0 refills | Status: AC

## 2022-03-11 NOTE — Progress Notes (Signed)
Denies allergies to eggs or soy products. Denies complication of anesthesia or sedation. Denies use of weight loss medication. Denies use of O2.   Emmi instructions given for colonoscopy.  

## 2022-03-19 ENCOUNTER — Telehealth: Payer: Self-pay | Admitting: Gastroenterology

## 2022-03-19 DIAGNOSIS — Z800 Family history of malignant neoplasm of digestive organs: Secondary | ICD-10-CM

## 2022-03-19 MED ORDER — PEG 3350-KCL-NA BICARB-NACL 420 G PO SOLR: 4000.0000 mL | Freq: Once | ORAL | 0 refills | Status: AC

## 2022-03-19 NOTE — Telephone Encounter (Signed)
Sent in [employer]- called pt and made him aware - marie pv

## 2022-03-19 NOTE — Telephone Encounter (Signed)
Patient called regarding prep medication states [facility] does not have it yet also would like a call once done so he knows.

## 2022-04-08 ENCOUNTER — Encounter: Payer: Self-pay | Admitting: Gastroenterology

## 2022-04-08 ENCOUNTER — Ambulatory Visit (AMBULATORY_SURGERY_CENTER): Payer: BC Managed Care – PPO | Admitting: Gastroenterology

## 2022-04-08 VITALS — BP 120/74 | HR 53 | Temp 96.0°F | Resp 19 | Ht 73.0 in | Wt 260.0 lb

## 2022-04-08 DIAGNOSIS — K635 Polyp of colon: Secondary | ICD-10-CM | POA: Diagnosis not present

## 2022-04-08 DIAGNOSIS — D124 Benign neoplasm of descending colon: Secondary | ICD-10-CM

## 2022-04-08 DIAGNOSIS — Z1211 Encounter for screening for malignant neoplasm of colon: Secondary | ICD-10-CM

## 2022-04-08 DIAGNOSIS — Z800 Family history of malignant neoplasm of digestive organs: Secondary | ICD-10-CM

## 2022-04-08 MED ORDER — SODIUM CHLORIDE 0.9 % IV SOLN: 500.0000 mL | INTRAVENOUS | Status: DC

## 2022-04-08 NOTE — Patient Instructions (Signed)
Information on polyps, diverticulosis and hemorrhoids given to you today.  Await pathology results.  Resume previous diet and medications.  Repeat colonoscopy in 5 years for surveillance based on pathology results.    YOU HAD AN ENDOSCOPIC PROCEDURE TODAY AT [facility]:   Refer to the procedure report that was given to you for any specific questions about what was found during the examination.  If the procedure report does not answer your questions, please call your gastroenterologist to clarify.  If you requested that your care partner not be given the details of your procedure findings, then the procedure report has been included in a sealed envelope for you to review at your convenience later.  YOU SHOULD EXPECT: Some feelings of bloating in the abdomen. Passage of more gas than usual.  Walking can help get rid of the air that was put into your GI tract during the procedure and reduce the bloating. If you had a lower endoscopy (such as a colonoscopy or flexible sigmoidoscopy) you may notice spotting of blood in your stool or on the toilet paper. If you underwent a bowel prep for your procedure, you may not have a normal bowel movement for a few days.  Please Note:  You might notice some irritation and congestion in your nose or some drainage.  This is from the oxygen used during your procedure.  There is no need for concern and it should clear up in a day or so.  SYMPTOMS TO REPORT IMMEDIATELY:  Following lower endoscopy (colonoscopy or flexible sigmoidoscopy):  Excessive amounts of blood in the stool  Significant tenderness or worsening of abdominal pains  Swelling of the abdomen that is new, acute  Fever of 100F or higher   For urgent or emergent issues, a gastroenterologist can be reached at any hour by calling [phone]. Do not use MyChart messaging for urgent concerns.    DIET:  We do recommend a small meal at first, but then you may proceed to your  regular diet.  Drink plenty of fluids but you should avoid alcoholic beverages for 24 hours.  ACTIVITY:  You should plan to take it easy for the rest of today and you should NOT DRIVE or use heavy machinery until tomorrow (because of the sedation medicines used during the test).    FOLLOW UP: Our staff will call the number listed on your records 24-72 hours following your procedure to check on you and address any questions or concerns that you may have regarding the information given to you following your procedure. If we do not reach you, we will leave a message.  We will attempt to reach you two times.  During this call, we will ask if you have developed any symptoms of COVID 19. If you develop any symptoms (ie: fever, flu-like symptoms, shortness of breath, cough etc.) before then, please call [phone].  If you test positive for Covid 19 in the 2 weeks post procedure, please call and report this information to [place].    If any biopsies were taken you will be contacted by phone or by letter within the next 1-3 weeks.  Please call us at [phone] if you have not heard about the biopsies in 3 weeks.    SIGNATURES/CONFIDENTIALITY: You and/or your care partner have signed paperwork which will be entered into your electronic medical record.  These signatures attest to the fact that that the information above on your After Visit Summary has been reviewed and is understood.  Full responsibility of the confidentiality of this discharge information lies with you and/or your care-partner.  

## 2022-04-08 NOTE — Progress Notes (Signed)
Pt's states no medical or surgical changes since previsit or office visit. 

## 2022-04-08 NOTE — Op Note (Signed)
[facility] Patient Name: Brian Beasley Procedure Date: 04/08/2022 10:53 AM MRN: [id number] Endoscopist: Ladene Artist , MD Age: 61 Referring MD:  Date of Birth: Dec 15, 1960 Gender: Male Account #: [id number] Procedure:                Colonoscopy Indications:              Screening in patient at increased risk: Family                            history of 1st-degree relative with colorectal                            cancer Medicines:                Monitored Anesthesia Care Procedure:                Pre-Anesthesia Assessment:                           - Prior to the procedure, a History and Physical                            was performed, and patient medications and                            allergies were reviewed. The patient's tolerance of                            previous anesthesia was also reviewed. The risks                            and benefits of the procedure and the sedation                            options and risks were discussed with the patient.                            All questions were answered, and informed consent                            was obtained. Prior Anticoagulants: The patient has                            taken no previous anticoagulant or antiplatelet                            agents. ASA Grade Assessment: II - A patient with                            mild systemic disease. After reviewing the risks                            and benefits, the patient was deemed in  satisfactory condition to undergo the procedure.                           After obtaining informed consent, the colonoscope                            was passed under direct vision. Throughout the                            procedure, the patient's blood pressure, pulse, and                            oxygen saturations were monitored continuously. The                            Olympus CF-HQ190L ([id number]) Colonoscope was                             introduced through the anus and advanced to the the                            cecum, identified by appendiceal orifice and                            ileocecal valve. The ileocecal valve, appendiceal                            orifice, and rectum were photographed. The quality                            of the bowel preparation was good. The colonoscopy                            was performed without difficulty. The patient                            tolerated the procedure well. Scope In: 11:02:59 AM Scope Out: 11:18:54 AM Scope Withdrawal Time: 0 hours 14 minutes 26 seconds  Total Procedure Duration: 0 hours 15 minutes 55 seconds  Findings:                 The perianal and digital rectal examinations were                            normal.                           A 7 mm polyp was found in the descending colon. The                            polyp was sessile. The polyp was removed with a                            cold snare. Resection and retrieval were complete.  Many small-mouthed diverticula were found in the                            left colon. There was no evidence of diverticular                            bleeding.                           Internal hemorrhoids were found during                            retroflexion. The hemorrhoids were small and Grade                            I (internal hemorrhoids that do not prolapse).                           The exam was otherwise without abnormality on                            direct and retroflexion views. Complications:            No immediate complications. Estimated blood loss:                            None. Estimated Blood Loss:     Estimated blood loss: none. Impression:               - One 7 mm polyp in the descending colon. Removed                            and retrieved.                           - Moderate diverticulosis in the left colon.                           -  Internal hemorrhoids.                           - The examination was otherwise normal on direct                            and retroflexion views.                           - No specimens collected. Recommendation:           - Repeat colonoscopy in 5 years for surveillance                            based on pathology results.                           - Patient has a contact number available for  emergencies. The signs and symptoms of potential                            delayed complications were discussed with the                            patient. Return to normal activities tomorrow.                            Written discharge instructions were provided to the                            patient.                           - High fiber diet.                           - Continue present medications.                           - Await pathology results. Ladene Artist, MD 04/08/2022 11:27:45 AM This report has been signed electronically.

## 2022-04-08 NOTE — Progress Notes (Signed)
Called to room to assist during endoscopic procedure.  Patient ID and intended procedure confirmed with present staff. Received instructions for my participation in the procedure from the performing physician.  

## 2022-04-08 NOTE — Progress Notes (Signed)
A and O x3. Report to RN. Tolerated MAC anesthesia well. 

## 2022-04-08 NOTE — Progress Notes (Signed)
History & Physical  Primary Care Physician:  Lujean Amel, MD Primary Gastroenterologist: Lucio Edward, MD  CHIEF COMPLAINT:   Family history of colon cancer  HPI: Brian Beasley is a 61 y.o. male with a family history of colon cancer in a first-degree relative for colonoscopy.   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 Tsuei, MD;  Location: [facility];  Service: General;  Laterality: N/A;   HAND SURGERY     L hand, had 3 screws put in to repair bone   KNEE SURGERY Right    NASAL SINUS SURGERY     opened passages to improve drainage    Prior to Admission medications   Medication Sig Start Date End Date Taking? Authorizing Provider  fluticasone (FLONASE) 50 MCG/ACT nasal spray Place 2 sprays into both nostrils daily as needed for allergies. 07/16/16  Yes [provider]  hydrochlorothiazide (MICROZIDE) 12.5 MG capsule Take 12.5 mg by mouth daily.   Yes [provider]  insulin detemir (LEVEMIR) 100 UNIT/ML injection Inject 15 Units into the skin daily.   Yes [provider]  losartan (COZAAR) 50 MG tablet Take 50 mg by mouth daily. 09/12/19  Yes [provider]  metFORMIN (GLUCOPHAGE) 1000 MG tablet Take 1,000 mg by mouth 2 (two) times daily with a meal.   Yes [provider]  omeprazole (PRILOSEC OTC) 20 MG tablet Take 20 mg by mouth daily.   Yes [provider]  rosuvastatin (CRESTOR) 5 MG tablet Take 5 mg by mouth daily.   Yes [provider]  Semaglutide, 1 MG/DOSE, (OZEMPIC, 1 MG/DOSE,) 4 MG/3ML SOPN Inject 4 mg into the skin. Inject once weekly.   Yes [provider]  ZETIA 10 MG tablet Take 10 mg by mouth every evening.  Patient not taking: Reported on 03/11/2022 08/04/13   [provider]    Current Outpatient Medications  Medication Sig Dispense Refill   fluticasone (FLONASE) 50 MCG/ACT nasal spray Place 2 sprays into both nostrils daily as needed for allergies.     hydrochlorothiazide (MICROZIDE) 12.5 MG capsule Take 12.5 mg by mouth daily.     insulin detemir (LEVEMIR) 100 UNIT/ML injection Inject 15 Units into the skin daily.     losartan (COZAAR) 50 MG tablet Take 50 mg by mouth daily.     metFORMIN (GLUCOPHAGE) 1000 MG tablet Take 1,000 mg by mouth 2 (two) times daily with a meal.     omeprazole (PRILOSEC OTC) 20 MG tablet Take 20 mg by mouth daily.     rosuvastatin (CRESTOR) 5 MG tablet Take 5 mg by mouth daily.     Semaglutide, 1 MG/DOSE, (OZEMPIC, 1 MG/DOSE,) 4 MG/3ML SOPN Inject 4 mg into the skin. Inject once weekly.     ZETIA 10 MG tablet Take 10 mg  by mouth every evening.  (Patient not taking: Reported on 03/11/2022)     Current Facility-Administered Medications  Medication Dose Route Frequency Provider Last Rate Last Admin   0.9 %  sodium chloride infusion  500 mL Intravenous Continuous Ladene Artist, MD        Allergies as of 04/08/2022 - Review Complete 04/08/2022  Allergen Reaction Noted   Lisinopril Cough 03/11/2022   Welchol [colesevelam hcl] Diarrhea and Nausea Only 12/26/2013   Zocor [simvastatin]  12/26/2013    Family History  Problem Relation Age of Onset   Heart disease Mother    Diabetes Mother    Colon cancer Father    Esophageal cancer Neg Hx    Rectal cancer Neg Hx    Stomach cancer Neg Hx     Social History   Socioeconomic History   Marital status:  Married    Spouse name: Not on file   Number of children: Not on file   Years of education: Not on file   Highest education level: Not on file  Occupational History   Not on file  Tobacco Use   Smoking status: Never   Smokeless tobacco: Current    Types: Chew  Substance and Sexual Activity   Alcohol use: Not Currently   Drug use: No   Sexual activity: Yes  Other Topics Concern   Not on file  Social History Narrative   Not on file   Social Determinants of Health   Financial Resource Strain: Not on file  Food Insecurity: Not on file  Transportation Needs: Not on file  Physical Activity: Not on file  Stress: Not on file  Social Connections: Not on file  Intimate Partner Violence: Not on file    Review of Systems:  All systems reviewed an negative except where noted in HPI.  Gen: Denies any fever, chills, sweats, anorexia, fatigue, weakness, malaise, weight loss, and sleep disorder CV: Denies chest pain, angina, palpitations, syncope, orthopnea, PND, peripheral edema, and claudication. Resp: Denies dyspnea at rest, dyspnea with exercise, cough, sputum, wheezing, coughing up blood, and pleurisy. GI: Denies vomiting blood, jaundice, and fecal incontinence.   Denies dysphagia or odynophagia. GU : Denies urinary burning, blood in urine, urinary frequency, urinary hesitancy, nocturnal urination, and urinary incontinence. MS: Denies joint pain, limitation of movement, and swelling, stiffness, low back pain, extremity pain. Denies muscle weakness, cramps, atrophy.  Derm: Denies rash, itching, dry skin, hives, moles, warts, or unhealing ulcers.  Psych: Denies depression, anxiety, memory loss, suicidal ideation, hallucinations, paranoia, and confusion. Heme: Denies bruising, bleeding, and enlarged lymph nodes. Neuro:  Denies any headaches, dizziness, paresthesias. Endo:  Denies any problems with DM, thyroid, adrenal function.   Physical Exam: General:  Alert, well-developed, in  NAD Head:  Normocephalic and atraumatic. Eyes:  Sclera clear, no icterus.   Conjunctiva pink. Ears:  Normal auditory acuity. Mouth:  No deformity or lesions.  Neck:  Supple; no masses . Lungs:  Clear throughout to auscultation.   No wheezes, crackles, or rhonchi. No acute distress. Heart:  Regular rate and rhythm; no murmurs. Abdomen:  Soft, nondistended, nontender. No masses, hepatomegaly. No obvious masses.  Normal bowel .    Rectal:  Deferred   Msk:  Symmetrical without gross deformities.. Pulses:  Normal pulses noted. Extremities:  Without edema. Neurologic:  Alert and  oriented x4;  grossly normal neurologically. Skin:  Intact without significant lesions or rashes. Cervical Nodes:  No significant cervical adenopathy. Psych:  Alert and cooperative. Normal mood and  affect.   Impression / Plan:   Family history of colon cancer in a first-degree relative for colonoscopy.  Pricilla Riffle. Fuller Plan  04/08/2022, 10:55 AM See Shea Evans, [facility], to contact our on call provider

## 2022-04-09 ENCOUNTER — Telehealth: Payer: Self-pay | Admitting: *Deleted

## 2022-04-09 NOTE — Telephone Encounter (Signed)
  Follow up Call-     04/08/2022   10:29 AM  Call back number  Post procedure Call Back phone  # [phone]  Permission to leave phone message Yes    First attempt for follow up phone call. No answer at number given. Unable to LM due to no voicemail set up.

## 2022-04-18 ENCOUNTER — Encounter: Payer: Self-pay | Admitting: Gastroenterology

## 2022-04-21 ENCOUNTER — Telehealth (INDEPENDENT_AMBULATORY_CARE_PROVIDER_SITE_OTHER): Payer: BC Managed Care – PPO | Admitting: Cardiology

## 2022-04-21 ENCOUNTER — Encounter: Payer: Self-pay | Admitting: Cardiology

## 2022-04-21 ENCOUNTER — Telehealth: Payer: Self-pay | Admitting: *Deleted

## 2022-04-21 VITALS — Ht 73.0 in | Wt 250.0 lb

## 2022-04-21 DIAGNOSIS — I10 Essential (primary) hypertension: Secondary | ICD-10-CM

## 2022-04-21 DIAGNOSIS — G4733 Obstructive sleep apnea (adult) (pediatric): Secondary | ICD-10-CM | POA: Diagnosis not present

## 2022-04-21 NOTE — Telephone Encounter (Signed)
Order placed to [facility] via community message. 

## 2022-10-10 ENCOUNTER — Other Ambulatory Visit (HOSPITAL_COMMUNITY): Payer: Self-pay | Admitting: Urology

## 2022-10-10 DIAGNOSIS — C61 Malignant neoplasm of prostate: Secondary | ICD-10-CM

## 2022-10-17 ENCOUNTER — Encounter (HOSPITAL_COMMUNITY)
Admission: RE | Admit: 2022-10-17 | Discharge: 2022-10-17 | Disposition: A | Discharge status: Home / Self Care | Payer: BC Managed Care – PPO | Source: Ambulatory Visit | Attending: Urology | Admitting: Urology

## 2022-10-17 DIAGNOSIS — C61 Malignant neoplasm of prostate: Secondary | ICD-10-CM | POA: Diagnosis not present

## 2022-10-17 MED ORDER — PIFLIFOLASTAT F 18 (PYLARIFY) INJECTION
9.0000 | Freq: Once | INTRAVENOUS | Status: AC
  Administered 2022-10-17: 9.4 via INTRAVENOUS

## 2022-10-29 ENCOUNTER — Other Ambulatory Visit: Payer: Self-pay | Admitting: Urology

## 2022-10-31 NOTE — Patient Instructions (Addendum)
SURGICAL WAITING ROOM VISITATION Patients having surgery or a procedure may have no more than 2 support people in the waiting area - these visitors may rotate.    If the patient needs to stay at the hospital during part of their recovery, the visitor guidelines for inpatient rooms apply. Pre-op nurse will coordinate an appropriate time for 1 support person to accompany patient in pre-op.  This support person may not rotate.    Please refer to the [facility] website for the visitor guidelines for Inpatients (after your surgery is over and you are in a regular room).   Due to an increase in RSV and influenza rates and associated hospitalizations, children ages 81 and under may not visit patients in [place].     Your procedure is scheduled on: 11-13-22   Report to [facility] Main Entrance    Report to admitting at 9:00 AM   Call this number if you have problems the morning of surgery [phone]   Follow a clear liquid diet day before surgery    Do not eat food or drink liqiuds :After Midnight.          If you have questions, please contact your surgeon's office.   FOLLOW BOWEL PREP AND ANY ADDITIONAL PRE OP INSTRUCTIONS YOU RECEIVED FROM YOUR SURGEON'S OFFICE!!!    Magnesium citrate - drink one bottle at noon the day before surgery  Fleet enema - the night before surgery   Oral Hygiene is also important to reduce your risk of infection.                                    Remember - BRUSH YOUR TEETH THE MORNING OF SURGERY WITH YOUR REGULAR TOOTHPASTE   Do NOT smoke after Midnight   Take these medicines the morning of surgery with A SIP OF WATER:   Omeprazole  How to Manage Your Diabetes Before and After Surgery  Why is it important to control my blood sugar before and after surgery? Improving blood sugar levels before and after surgery helps healing and can limit problems. A way of improving blood sugar control is eating a healthy diet by:  Eating  less sugar and carbohydrates  Increasing activity/exercise  Talking with your doctor about reaching your blood sugar goals High blood sugars (greater than 180 mg/dL) can raise your risk of infections and slow your recovery, so you will need to focus on controlling your diabetes during the weeks before surgery. Make sure that the doctor who takes care of your diabetes knows about your planned surgery including the date and location.  How do I manage my blood sugar before surgery? Check your blood sugar at least 4 times a day, starting 2 days before surgery, to make sure that the level is not too high or low. Check your blood sugar the morning of your surgery when you wake up and every 2 hours until you get to the Short Stay unit. If your blood sugar is less than 70 mg/dL, you will need to treat for low blood sugar: Do not take insulin. Treat a low blood sugar (less than 70 mg/dL) with  cup of clear juice (cranberry or apple), 4 glucose tablets, OR glucose gel. Recheck blood sugar in 15 minutes after treatment (to make sure it is greater than 70 mg/dL). If your blood sugar is not greater than 70 mg/dL on recheck, call [phone] for  further instructions. Report your blood sugar to the short stay nurse when you get to Short Stay.  If you are admitted to the hospital after surgery: Your blood sugar will be checked by the staff and you will probably be given insulin after surgery (instead of oral diabetes medicines) to make sure you have good blood sugar levels. The goal for blood sugar control after surgery is 80-180 mg/dL.   WHAT DO I DO ABOUT MY DIABETES MEDICATION?  Do not take oral diabetes medicines (pills) the morning of surgery.        Hold Trulicity 7 days before surgery (do not take after 11-05-22)  THE NIGHT BEFORE SURGERY:  Take 5 units of insulin Detemir.      DO NOT TAKE THE FOLLOWING 7 DAYS PRIOR TO SURGERY: Ozempic, Wegovy, Rybelsus (Semaglutide), Byetta (exenatide), Bydureon  (exenatide ER), Victoza, Saxenda (liraglutide), or Trulicity (dulaglutide) Mounjaro (Tirzepatide) Adlyxin (Lixisenatide), Polyethylene Glycol Loxenatide.  Reviewed and Endorsed by [facility] Patient Education Committee, August 2015  Bring CPAP mask and tubing day of surgery.                              You may not have any metal on your body including jewelry, and body piercing             Do not wear lotions, powders, cologne, or deodorant              Men may shave face and neck.   Do not bring valuables to the hospital. [place].   Contacts, dentures or bridgework may not be worn into surgery.   Bring small overnight bag day of surgery.   DO NOT [place]. PHARMACY WILL DISPENSE MEDICATIONS LISTED ON YOUR MEDICATION LIST TO YOU DURING YOUR ADMISSION [place]!    Special Instructions: Bring a copy of your healthcare power of attorney and living will documents the day of surgery if you haven't scanned them before.              Please read over the following fact sheets you were given: IF Brian Beasley  If you received a COVID test during your pre-op visit  it is requested that you wear a mask when out in public, stay away from anyone that may not be feeling well and notify your surgeon if you develop symptoms. If you test positive for Covid or have been in contact with anyone that has tested positive in the last 10 days please notify you surgeon.  [place] - Preparing for Surgery Before surgery, you can play an important role.  Because skin is not sterile, your skin needs to be as free of germs as possible.  You can reduce the number of germs on your skin by washing with CHG (chlorahexidine gluconate) soap before surgery.  CHG is an antiseptic cleaner which kills germs and bonds with the skin to continue killing germs even after  washing. Please DO NOT use if you have an allergy to CHG or antibacterial soaps.  If your skin becomes reddened/irritated stop using the CHG and inform your nurse when you arrive at Short Stay. Do not shave (including legs and underarms) for at least 48 hours prior to the first CHG shower.  You may shave your face/neck.  Please follow these instructions carefully:  1.  Shower with CHG Soap the night before surgery and the  morning of surgery.  2.  If you choose to wash your hair, wash your hair first as usual with your normal  shampoo.  3.  After you shampoo, rinse your hair and body thoroughly to remove the shampoo.                             4.  Use CHG as you would any other liquid soap.  You can apply chg directly to the skin and wash.  Gently with a scrungie or clean washcloth.  5.  Apply the CHG Soap to your body ONLY FROM THE NECK DOWN.   Do   not use on face/ open                           Wound or open sores. Avoid contact with eyes, ears mouth and   genitals (private parts).                       Wash face,  Genitals (private parts) with your normal soap.             6.  Wash thoroughly, paying special attention to the area where your    surgery  will be performed.  7.  Thoroughly rinse your body with warm water from the neck down.  8.  DO NOT shower/wash with your normal soap after using and rinsing off the CHG Soap.                9.  Pat yourself dry with a clean towel.            10.  Wear clean pajamas.            11.  Place clean sheets on your bed the night of your first shower and do not  sleep with pets. Day of Surgery : Do not apply any lotions/deodorants the morning of surgery.  Please wear clean clothes to the hospital/surgery center.  FAILURE TO FOLLOW THESE INSTRUCTIONS MAY RESULT IN THE CANCELLATION OF YOUR SURGERY  PATIENT SIGNATURE_________________________________  NURSE  SIGNATURE__________________________________  ________________________________________________________________________    WHAT IS A BLOOD TRANSFUSION? Blood Transfusion Information  A transfusion is the replacement of blood or some of its parts. Blood is made up of multiple cells which provide different functions. Red blood cells carry oxygen and are used for blood loss replacement. White blood cells fight against infection. Platelets control bleeding. Plasma helps clot blood. Other blood products are available for specialized needs, such as hemophilia or other clotting disorders. BEFORE THE TRANSFUSION  Who gives blood for transfusions?  Healthy volunteers who are fully evaluated to make sure their blood is safe. This is blood bank blood. Transfusion therapy is the safest it has ever been in the practice of medicine. Before blood is taken from a donor, a complete history is taken to make sure that person has no history of diseases nor engages in risky social behavior (examples are intravenous drug use or sexual activity with multiple partners). The donor's travel history is screened to minimize risk of transmitting infections, such as malaria. The donated blood is tested for signs of infectious diseases, such as HIV and hepatitis. The blood is then tested to be sure it is compatible with you in order to minimize the chance of a transfusion reaction.  If you or a relative donates blood, this is often done in anticipation of surgery and is not appropriate for emergency situations. It takes many days to process the donated blood. RISKS AND COMPLICATIONS Although transfusion therapy is very safe and saves many lives, the main dangers of transfusion include:  Getting an infectious disease. Developing a transfusion reaction. This is an allergic reaction to something in the blood you were given. Every precaution is taken to prevent this. The decision to have a blood transfusion has been considered  carefully by your caregiver before blood is given. Blood is not given unless the benefits outweigh the risks. AFTER THE TRANSFUSION Right after receiving a blood transfusion, you will usually feel much better and more energetic. This is especially true if your red blood cells have gotten low (anemic). The transfusion raises the level of the red blood cells which carry oxygen, and this usually causes an energy increase. The nurse administering the transfusion will monitor you carefully for complications. HOME CARE INSTRUCTIONS  No special instructions are needed after a transfusion. You may find your energy is better. Speak with your caregiver about any limitations on activity for underlying diseases you may have. SEEK MEDICAL CARE IF:  Your condition is not improving after your transfusion. You develop redness or irritation at the intravenous (IV) site. SEEK IMMEDIATE MEDICAL CARE IF:  Any of the following symptoms occur over the next 12 hours: Shaking chills. You have a temperature by mouth above 102 F (38.9 C), not controlled by medicine. Chest, back, or muscle pain. People around you feel you are not acting correctly or are confused. Shortness of breath or difficulty breathing. Dizziness and fainting. You get a rash or develop hives. You have a decrease in urine output. Your urine turns a dark color or changes to pink, red, or brown. Any of the following symptoms occur over the next 10 days: You have a temperature by mouth above 102 F (38.9 C), not controlled by medicine. Shortness of breath. Weakness after normal activity. The white part of the eye turns yellow (jaundice). You have a decrease in the amount of urine or are urinating less often. Your urine turns a dark color or changes to pink, red, or brown. Document Released: 10/10/2000 Document Revised: 01/05/2012 Document Reviewed: 05/29/2008 [employer] Patient Information 2014 [place],  [place].  _______________________________________________________________________

## 2022-10-31 NOTE — Progress Notes (Addendum)
COVID Vaccine Completed:  Date of COVID positive in last 90 days:  PCP - Dibas Koirala, MD Cardiologist - Fransico Him, MD for OSA  Chest x-ray -  EKG -  Stress Test -  ECHO -  Cardiac Cath -  Pacemaker/ICD device last checked: Spinal Cord Stimulator:  Bowel Prep - Yes, Magnesium citrate and Fleet enema  Sleep Study - Yes, +sleep apnea CPAP -   Fasting Blood Sugar -  Checks Blood Sugar _____ times a day  Trulicity Last dose of GLP1 agonist-  N/A GLP1 instructions:  Hold 7 days before surgery (do not take after 11-05-22)   Last dose of SGLT-2 inhibitors-  N/A SGLT-2 instructions: N/A  Blood Thinner Instructions:  N/A Aspirin Instructions: Last Dose:  Activity level:  Can go up a flight of stairs and perform activities of daily living without stopping and without symptoms of chest pain or shortness of breath.  Able to exercise without symptoms  Unable to go up a flight of stairs without symptoms of     Anesthesia review:   Patient denies shortness of breath, fever, cough and chest pain at PAT appointment  Patient verbalized understanding of instructions that were given to them at the PAT appointment. Patient was also instructed that they will need to review over the PAT instructions again at home before surgery.

## 2022-11-04 ENCOUNTER — Encounter (HOSPITAL_COMMUNITY): Payer: Self-pay

## 2022-11-04 ENCOUNTER — Other Ambulatory Visit: Payer: Self-pay

## 2022-11-04 ENCOUNTER — Encounter (HOSPITAL_COMMUNITY)
Admission: RE | Admit: 2022-11-04 | Discharge: 2022-11-04 | Disposition: A | Discharge status: Home / Self Care | Payer: BC Managed Care – PPO | Source: Ambulatory Visit | Attending: Urology | Admitting: Urology

## 2022-11-04 VITALS — BP 150/90 | HR 95 | Temp 98.5°F | Resp 16 | Ht 73.0 in | Wt 279.4 lb

## 2022-11-04 DIAGNOSIS — Z01818 Encounter for other preprocedural examination: Secondary | ICD-10-CM | POA: Diagnosis not present

## 2022-11-04 DIAGNOSIS — I2510 Atherosclerotic heart disease of native coronary artery without angina pectoris: Secondary | ICD-10-CM | POA: Diagnosis not present

## 2022-11-04 DIAGNOSIS — Z794 Long term (current) use of insulin: Secondary | ICD-10-CM | POA: Insufficient documentation

## 2022-11-04 DIAGNOSIS — E119 Type 2 diabetes mellitus without complications: Secondary | ICD-10-CM | POA: Diagnosis not present

## 2022-11-04 HISTORY — DX: Malignant neoplasm of prostate: C61

## 2022-11-04 LAB — CBC
HCT: 48.6 % (ref 39.0–52.0)
Hemoglobin: 16.1 g/dL (ref 13.0–17.0)
MCH: 31.4 pg (ref 26.0–34.0)
MCHC: 33.1 g/dL (ref 30.0–36.0)
MCV: 94.7 fL (ref 80.0–100.0)
Platelets: 208 10*3/uL (ref 150–400)
RBC: 5.13 MIL/uL (ref 4.22–5.81)
RDW: 11.5 % (ref 11.5–15.5)
WBC: 7.9 10*3/uL (ref 4.0–10.5)
nRBC: 0 % (ref 0.0–0.2)

## 2022-11-04 LAB — HEMOGLOBIN A1C
Hgb A1c MFr Bld: 5.7 % — ABNORMAL HIGH (ref 4.8–5.6)
Mean Plasma Glucose: 116.89 mg/dL

## 2022-11-04 LAB — BASIC METABOLIC PANEL
Anion gap: 9 (ref 5–15)
BUN: 13 mg/dL (ref 8–23)
CO2: 25 mmol/L (ref 22–32)
Calcium: 9.2 mg/dL (ref 8.9–10.3)
Chloride: 106 mmol/L (ref 98–111)
Creatinine, Ser: 0.92 mg/dL (ref 0.61–1.24)
GFR, Estimated: >60 mL/min (ref >60)
Glucose, Bld: 119 mg/dL — ABNORMAL HIGH (ref 70–99)
Potassium: 4.3 mmol/L (ref 3.5–5.1)
Sodium: 140 mmol/L (ref 135–145)

## 2022-11-04 LAB — GLUCOSE, CAPILLARY: Glucose-Capillary: 146 mg/dL — ABNORMAL HIGH (ref 70–99)

## 2022-11-12 NOTE — H&P (Signed)
CC: Prostate Cancer   Physician requesting consult: Dr. Donald Pore  PCP:   Brian Beasley is a 62 year old gentleman who was found to have an elevated PSA prompting a TRUS biopsy of the prostate on 09/15/22. This confirmed Gleason 4+3=7 adenocarcinoma of the prostate with 4 out of 12 biopsy cores positive for malignancy.   Family history: None.   Imaging studies: PSMA PET scan (PENDING):   PMH: He has a history of hypertension, diabetes, GERD, hyperlipidemia, and sleep apnea.  PSH: Cholecystectomy (L/S)   TNM stage: cT1c N0 M0  PSA: 9.86  Gleason score: 4+3=7 (GG 3)  Biopsy (09/15/22): 4/12 cores positive  Left: Benign  Right: R apex (20%, 4+3=7), R lateral mid (40%, 4+3=7, PNI), R base (20%, 4+3=7), R lateral base (40%, 4+3=7)  Prostate volume: 83.6 cc   Nomogram  OC disease: 43%  EPE: 52%  SVI: 10%  LNI: 11%  PFS (5 year, 10 year): 58%, 42%   Urinary function: IPSS is 14.  Erectile function: SHIM score is 5. He was recently prescribed tadalafil and has seen some partial improvement but still erections that are somewhat difficult for intercourse.     ALLERGIES: No Allergies    MEDICATIONS: Hydrochlorothiazide  Metformin Hcl  Omeprazole  Tadalafil 5 mg tablet 1 tablet PO Daily  Losartan Potassium  Rosuvastatin Calcium     GU PSH: Prostate Needle Biopsy - 09/15/2022       PSH Notes: hand 1980  knee 2013   NON-GU PSH: Cholecystectomy (open) Surgical Pathology, Gross And Microscopic Examination For Prostate Needle - 09/15/2022     GU PMH: Elevated PSA - 09/25/2022, - 09/15/2022, - 08/08/2022 Prostate Cancer - 09/25/2022 Male ED, unspecified - 08/08/2022      PMH Notes: skin cancer   NON-GU PMH: Diabetes Type 2 GERD Hypercholesterolemia Hypertension Sleep Apnea    FAMILY HISTORY: No Family History    SOCIAL HISTORY: Marital Status: Married Current Smoking Status: Patient has never smoked.   Tobacco Use Assessment Completed: Used Tobacco in last 30  days?    REVIEW OF SYSTEMS:    GU Review Male:   Patient denies frequent urination, hard to postpone urination, burning/ pain with urination, get up at night to urinate, leakage of urine, stream starts and stops, trouble starting your streams, and have to strain to urinate .  Gastrointestinal (Lower):   Patient denies diarrhea and constipation.  Gastrointestinal (Upper):   Patient denies nausea and vomiting.  Constitutional:   Patient denies fever, night sweats, weight loss, and fatigue.  Skin:   Patient denies skin rash/ lesion and itching.  Eyes:   Patient denies blurred vision and double vision.  Ears/ Nose/ Throat:   Patient denies sore throat and sinus problems.  Hematologic/Lymphatic:   Patient denies swollen glands and easy bruising.  Cardiovascular:   Patient denies leg swelling and chest pains.  Respiratory:   Patient denies shortness of breath and cough.  Endocrine:   Patient denies excessive thirst.  Musculoskeletal:   Patient denies back pain and joint pain.  Neurological:   Patient denies headaches and dizziness.  Psychologic:   Patient denies depression and anxiety.   VITAL SIGNS:      10/10/2022 11:18 AM  Weight 250 lb / 113.4 kg  Height 73 in / 185.42 cm  BP 131/72 mmHg  Pulse 74 /min  Temperature 98.0 F / 36.6 C  BMI 33.0 kg/m   MULTI-SYSTEM PHYSICAL EXAMINATION:    Constitutional: Well-nourished. No physical deformities. Normally  developed. Good grooming.  Gastrointestinal: Mildly obese.     Complexity of Data:  Lab Test Review:   PSA  Records Review:   Pathology Reports, Previous Patient Records   08/08/22  PSA  Total PSA 9.86 ng/mL    PROCEDURES:          Urinalysis - 81003 Dipstick Dipstick Cont'd  Color: Yellow Bilirubin: Neg  Appearance: Clear Ketones: Trace  Specific Gravity: 1.030 Blood: Neg  pH: 5.5 Protein: Trace  Glucose: Neg Urobilinogen: 0.2    Nitrites: Neg    Leukocyte Esterase: Neg         Urinalysis - 81003 Dipstick Dipstick  Cont'd  Color: Yellow Bilirubin: Neg mg/dL  Appearance: Clear Ketones: Trace mg/dL  Specific Gravity: 1.030 Blood: Neg ery/uL  pH: 5.5 Protein: Trace mg/dL  Glucose: Neg mg/dL Urobilinogen: 0.2 mg/dL    Nitrites: Neg    Leukocyte Esterase: Neg leu/uL    ASSESSMENT:      ICD-10 Details  1 GU:   Prostate Cancer - C61    PLAN:           Schedule Return Visit/Planned Activity: Next Available Appointment - PT Referral             Note: Please schedule patient for preoperative PT prior to radical prostatectomy.    Return Visit/Planned Activity: Other See Visit Notes             Note: Will call to schedule surgery.          Document Letter(s):  Created for Patient: Clinical Summary         Notes:   1. Unfavorable intermediate risk prostate cancer: I had a detailed discussion with Brian Beasley and his wife today regarding his prostate cancer situation. His PSMA PET scan remains pending although has been approved. We are following up on making sure this gets scheduled. Assuming this is negative, I did recommend therapy of curative intent based on his age, life expectancy, and disease parameters.   The patient was counseled about the natural history of prostate cancer and the standard treatment options that are available for prostate cancer. It was explained to him how his age and life expectancy, clinical stage, Gleason score/prognostic grade group, and PSA (and PSA density) affect his prognosis, the decision to proceed with additional staging studies, as well as how that information influences recommended treatment strategies. We discussed the roles for active surveillance, radiation therapy, surgical therapy, androgen deprivation, as well as ablative therapy and other investigational options for the treatment of prostate cancer as appropriate to his individual cancer situation. We discussed the risks and benefits of these options with regard to their impact on cancer control and also in terms of  potential adverse events, complications, and impact on quality of life particularly related to urinary and sexual function. The patient was encouraged to ask questions throughout the discussion today and all questions were answered to his stated satisfaction. In addition, the patient was provided with and/or directed to appropriate resources and literature for further education about prostate cancer and treatment options. We discussed surgical therapy for prostate cancer including the different available surgical approaches. We discussed, in detail, the risks and expectations of surgery with regard to cancer control, urinary control, and erectile function as well as the expected postoperative recovery process. Additional risks of surgery including but not limited to bleeding, infection, hernia formation, nerve damage, lymphocele formation, bowel/rectal injury potentially necessitating colostomy, damage to the urinary tract resulting in urine leakage, urethral  stricture, and the cardiopulmonary risks such as myocardial infarction, stroke, death, venothromboembolism, etc. were explained. The risk of open surgical conversion for robotic/laparoscopic prostatectomy was also discussed.   I offered him a radiation oncology consultation which he declines. He does adamantly wish to proceed with surgical therapy especially considering his prostate size and lower urinary tract symptoms. He will be scheduled for a bilateral nerve sparing robot-assisted laparoscopic radical prostatectomy and bilateral pelvic lymphadenectomy.   CC: Dr. Donald Pore        Next Appointment:      Next Appointment: 11/25/2022 04:00 PM    Appointment Type: 54 Physical Therapy    Location: [facility] [phone]    Provider: Doran Durand    Reason for Visit: Next ava preoperative PT prior to radical prostatect        APPENDED NOTES:    I spoke to Brian Beasley today and reviewed his PSMA PET scan which does  demonstrate uptake on the right side of the prostate consistent with his biopsy as well as an isolated right pelvic lymph node. After discussing out his changes his prognosis and options for treatment particularly with duration of systemic androgen deprivation therapy with radiation, he does wish to continue to proceed with surgical treatment primarily. He understands the high risk of potentially necessitating adjuvant or salvage therapy in the future. We will plan for an extended pelvic lymphadenectomy at the time of his surgery.    * Signed by Raynelle Bring, M.D. on 10/29/22 at 10:40 AM (EST*

## 2022-11-13 ENCOUNTER — Encounter (HOSPITAL_COMMUNITY)
Admission: RE | Disposition: A | Discharge status: Home / Self Care | Payer: Self-pay | Source: Ambulatory Visit | Attending: Urology

## 2022-11-13 ENCOUNTER — Ambulatory Visit (HOSPITAL_COMMUNITY): Payer: BC Managed Care – PPO | Admitting: Anesthesiology

## 2022-11-13 ENCOUNTER — Observation Stay (HOSPITAL_COMMUNITY)
Admission: RE | Admit: 2022-11-13 | Discharge: 2022-11-14 | Disposition: A | Discharge status: Home / Self Care | Payer: BC Managed Care – PPO | Source: Ambulatory Visit | Attending: Urology | Admitting: Urology

## 2022-11-13 ENCOUNTER — Other Ambulatory Visit: Payer: Self-pay

## 2022-11-13 ENCOUNTER — Encounter (HOSPITAL_COMMUNITY): Payer: Self-pay | Admitting: Urology

## 2022-11-13 DIAGNOSIS — I10 Essential (primary) hypertension: Secondary | ICD-10-CM | POA: Diagnosis not present

## 2022-11-13 DIAGNOSIS — Z85828 Personal history of other malignant neoplasm of skin: Secondary | ICD-10-CM | POA: Insufficient documentation

## 2022-11-13 DIAGNOSIS — E119 Type 2 diabetes mellitus without complications: Secondary | ICD-10-CM | POA: Insufficient documentation

## 2022-11-13 DIAGNOSIS — C61 Malignant neoplasm of prostate: Principal | ICD-10-CM | POA: Insufficient documentation

## 2022-11-13 DIAGNOSIS — Z79899 Other long term (current) drug therapy: Secondary | ICD-10-CM | POA: Diagnosis not present

## 2022-11-13 DIAGNOSIS — I2510 Atherosclerotic heart disease of native coronary artery without angina pectoris: Secondary | ICD-10-CM

## 2022-11-13 HISTORY — PX: ROBOT ASSISTED LAPAROSCOPIC RADICAL PROSTATECTOMY: SHX5141

## 2022-11-13 HISTORY — PX: LYMPHADENECTOMY: SHX5960

## 2022-11-13 LAB — ABO/RH: ABO/RH(D): O POS

## 2022-11-13 LAB — GLUCOSE, CAPILLARY
Glucose-Capillary: 158 mg/dL — ABNORMAL HIGH (ref 70–99)
Glucose-Capillary: 117 mg/dL — ABNORMAL HIGH (ref 70–99)
Glucose-Capillary: 176 mg/dL — ABNORMAL HIGH (ref 70–99)
Glucose-Capillary: 190 mg/dL — ABNORMAL HIGH (ref 70–99)
Glucose-Capillary: 177 mg/dL — ABNORMAL HIGH (ref 70–99)

## 2022-11-13 LAB — HEMOGLOBIN AND HEMATOCRIT, BLOOD
HCT: 40 % (ref 39.0–52.0)
Hemoglobin: 13.3 g/dL (ref 13.0–17.0)

## 2022-11-13 LAB — TYPE AND SCREEN
ABO/RH(D): O POS
Antibody Screen: NEGATIVE

## 2022-11-13 SURGERY — XI ROBOTIC ASSISTED LAPAROSCOPIC RADICAL PROSTATECTOMY LEVEL 2
Anesthesia: General | Site: Abdomen

## 2022-11-13 MED ORDER — INSULIN ASPART 100 UNIT/ML IJ SOLN
0.0000 [IU] | INTRAMUSCULAR | Status: DC
  Administered 2022-11-13 – 2022-11-14 (×3): 3 [IU] via SUBCUTANEOUS
  Administered 2022-11-14: 2 [IU] via SUBCUTANEOUS

## 2022-11-13 MED ORDER — EPHEDRINE 5 MG/ML INJ
INTRAVENOUS | Status: AC
  Filled 2022-11-13: qty 5

## 2022-11-13 MED ORDER — PROPOFOL 10 MG/ML IV BOLUS
INTRAVENOUS | Status: AC
  Filled 2022-11-13: qty 20

## 2022-11-13 MED ORDER — FLEET ENEMA 7-19 GM/118ML RE ENEM: 1.0000 | ENEMA | Freq: Once | RECTAL | Status: DC

## 2022-11-13 MED ORDER — MIDAZOLAM HCL 2 MG/2ML IJ SOLN
INTRAMUSCULAR | Status: AC
  Filled 2022-11-13: qty 2

## 2022-11-13 MED ORDER — TRIPLE ANTIBIOTIC 3.5-400-5000 EX OINT: 1.0000 | TOPICAL_OINTMENT | Freq: Three times a day (TID) | CUTANEOUS | Status: DC | PRN

## 2022-11-13 MED ORDER — DIPHENHYDRAMINE HCL 12.5 MG/5ML PO ELIX: 12.5000 mg | ORAL_SOLUTION | Freq: Four times a day (QID) | ORAL | Status: DC | PRN

## 2022-11-13 MED ORDER — GLYCOPYRROLATE 0.2 MG/ML IJ SOLN
INTRAMUSCULAR | Status: DC | PRN
  Administered 2022-11-13: .2 mg via INTRAVENOUS

## 2022-11-13 MED ORDER — CEFAZOLIN IN SODIUM CHLORIDE 3-0.9 GM/100ML-% IV SOLN
3.0000 g | INTRAVENOUS | Status: AC
  Administered 2022-11-13: 3 g via INTRAVENOUS
  Filled 2022-11-13: qty 100

## 2022-11-13 MED ORDER — PROPOFOL 10 MG/ML IV BOLUS
INTRAVENOUS | Status: DC | PRN
  Administered 2022-11-13: 170 mg via INTRAVENOUS

## 2022-11-13 MED ORDER — SUGAMMADEX SODIUM 200 MG/2ML IV SOLN
INTRAVENOUS | Status: DC | PRN
  Administered 2022-11-13: 200 mg via INTRAVENOUS

## 2022-11-13 MED ORDER — ROSUVASTATIN CALCIUM 5 MG PO TABS
5.0000 mg | ORAL_TABLET | Freq: Every day | ORAL | Status: DC
  Administered 2022-11-13: 5 mg via ORAL
  Filled 2022-11-13: qty 1

## 2022-11-13 MED ORDER — ALBUMIN HUMAN 5 % IV SOLN: INTRAVENOUS | Status: DC | PRN

## 2022-11-13 MED ORDER — SULFAMETHOXAZOLE-TRIMETHOPRIM 800-160 MG PO TABS: 1.0000 | ORAL_TABLET | Freq: Two times a day (BID) | ORAL | 0 refills | Status: DC

## 2022-11-13 MED ORDER — BUPIVACAINE HCL 0.25 % IJ SOLN
INTRAMUSCULAR | Status: AC
  Filled 2022-11-13: qty 1

## 2022-11-13 MED ORDER — ONDANSETRON HCL 4 MG/2ML IJ SOLN
INTRAMUSCULAR | Status: DC | PRN
  Administered 2022-11-13: 4 mg via INTRAVENOUS

## 2022-11-13 MED ORDER — TRAMADOL HCL 50 MG PO TABS: 50.0000 mg | ORAL_TABLET | Freq: Four times a day (QID) | ORAL | 0 refills | Status: DC | PRN

## 2022-11-13 MED ORDER — CEFAZOLIN SODIUM-DEXTROSE 1-4 GM/50ML-% IV SOLN
1.0000 g | Freq: Three times a day (TID) | INTRAVENOUS | Status: AC
  Administered 2022-11-13 – 2022-11-14 (×2): 1 g via INTRAVENOUS
  Filled 2022-11-13 (×2): qty 50

## 2022-11-13 MED ORDER — FENTANYL CITRATE (PF) 100 MCG/2ML IJ SOLN
INTRAMUSCULAR | Status: AC
  Filled 2022-11-13: qty 2

## 2022-11-13 MED ORDER — BUPIVACAINE-EPINEPHRINE 0.25% -1:200000 IJ SOLN
INTRAMUSCULAR | Status: DC | PRN
  Administered 2022-11-13: 40 mL

## 2022-11-13 MED ORDER — CHLORHEXIDINE GLUCONATE 0.12 % MT SOLN
15.0000 mL | Freq: Once | OROMUCOSAL | Status: AC
  Administered 2022-11-13: 15 mL via OROMUCOSAL

## 2022-11-13 MED ORDER — FENTANYL CITRATE (PF) 100 MCG/2ML IJ SOLN
INTRAMUSCULAR | Status: DC | PRN
  Administered 2022-11-13: 50 ug via INTRAVENOUS
  Administered 2022-11-13: 100 ug via INTRAVENOUS
  Administered 2022-11-13: 50 ug via INTRAVENOUS

## 2022-11-13 MED ORDER — HEPARIN SODIUM (PORCINE) 1000 UNIT/ML IJ SOLN
INTRAMUSCULAR | Status: AC
  Filled 2022-11-13: qty 1

## 2022-11-13 MED ORDER — ROCURONIUM BROMIDE 10 MG/ML (PF) SYRINGE
PREFILLED_SYRINGE | INTRAVENOUS | Status: AC
  Filled 2022-11-13: qty 10

## 2022-11-13 MED ORDER — GLYCOPYRROLATE 0.2 MG/ML IJ SOLN
INTRAMUSCULAR | Status: AC
  Filled 2022-11-13: qty 1

## 2022-11-13 MED ORDER — PHENYLEPHRINE HCL (PRESSORS) 10 MG/ML IV SOLN
INTRAVENOUS | Status: AC
  Filled 2022-11-13: qty 1

## 2022-11-13 MED ORDER — OMEPRAZOLE MAGNESIUM 20 MG PO TBEC: 20.0000 mg | DELAYED_RELEASE_TABLET | Freq: Every day | ORAL | Status: DC

## 2022-11-13 MED ORDER — ROCURONIUM BROMIDE 100 MG/10ML IV SOLN
INTRAVENOUS | Status: DC | PRN
  Administered 2022-11-13: 20 mg via INTRAVENOUS
  Administered 2022-11-13: 40 mg via INTRAVENOUS
  Administered 2022-11-13: 70 mg via INTRAVENOUS

## 2022-11-13 MED ORDER — DOCUSATE SODIUM 100 MG PO CAPS: 100.0000 mg | ORAL_CAPSULE | Freq: Two times a day (BID) | ORAL | Status: DC

## 2022-11-13 MED ORDER — LIDOCAINE HCL (CARDIAC) PF 100 MG/5ML IV SOSY
PREFILLED_SYRINGE | INTRAVENOUS | Status: DC | PRN
  Administered 2022-11-13: 50 mg via INTRAVENOUS

## 2022-11-13 MED ORDER — INSULIN ASPART 100 UNIT/ML IJ SOLN
2.0000 [IU] | Freq: Once | INTRAMUSCULAR | Status: AC
  Administered 2022-11-13: 2 [IU] via SUBCUTANEOUS
  Filled 2022-11-13: qty 1

## 2022-11-13 MED ORDER — DOCUSATE SODIUM 100 MG PO CAPS
100.0000 mg | ORAL_CAPSULE | Freq: Two times a day (BID) | ORAL | Status: DC
  Administered 2022-11-13 – 2022-11-14 (×2): 100 mg via ORAL
  Filled 2022-11-13 (×2): qty 1

## 2022-11-13 MED ORDER — PHENYLEPHRINE 80 MCG/ML (10ML) SYRINGE FOR IV PUSH (FOR BLOOD PRESSURE SUPPORT)
PREFILLED_SYRINGE | INTRAVENOUS | Status: AC
  Filled 2022-11-13: qty 10

## 2022-11-13 MED ORDER — ORAL CARE MOUTH RINSE: 15.0000 mL | Freq: Once | OROMUCOSAL | Status: AC

## 2022-11-13 MED ORDER — MIDAZOLAM HCL 5 MG/5ML IJ SOLN
INTRAMUSCULAR | Status: DC | PRN
  Administered 2022-11-13: 2 mg via INTRAVENOUS

## 2022-11-13 MED ORDER — HYDROCHLOROTHIAZIDE 12.5 MG PO TABS
12.5000 mg | ORAL_TABLET | Freq: Every day | ORAL | Status: DC
  Administered 2022-11-14: 12.5 mg via ORAL
  Filled 2022-11-13: qty 1

## 2022-11-13 MED ORDER — FENTANYL CITRATE PF 50 MCG/ML IJ SOSY: 25.0000 ug | PREFILLED_SYRINGE | INTRAMUSCULAR | Status: DC | PRN

## 2022-11-13 MED ORDER — LACTATED RINGERS IV SOLN
INTRAVENOUS | Status: DC | PRN
  Administered 2022-11-13: 1 mL

## 2022-11-13 MED ORDER — PANTOPRAZOLE SODIUM 40 MG PO TBEC
40.0000 mg | DELAYED_RELEASE_TABLET | Freq: Every day | ORAL | Status: DC
  Administered 2022-11-13 – 2022-11-14 (×2): 40 mg via ORAL
  Filled 2022-11-13 (×3): qty 1

## 2022-11-13 MED ORDER — SODIUM CHLORIDE 0.9 % IV BOLUS
1000.0000 mL | Freq: Once | INTRAVENOUS | Status: AC
  Administered 2022-11-13: 1000 mL via INTRAVENOUS

## 2022-11-13 MED ORDER — FLUTICASONE PROPIONATE 50 MCG/ACT NA SUSP
2.0000 | Freq: Every day | NASAL | Status: DC
  Administered 2022-11-14: 2 via NASAL
  Filled 2022-11-13: qty 16

## 2022-11-13 MED ORDER — DEXAMETHASONE SODIUM PHOSPHATE 10 MG/ML IJ SOLN
INTRAMUSCULAR | Status: AC
  Filled 2022-11-13: qty 2

## 2022-11-13 MED ORDER — KETAMINE HCL 10 MG/ML IJ SOLN
INTRAMUSCULAR | Status: AC
  Filled 2022-11-13: qty 1

## 2022-11-13 MED ORDER — DEXAMETHASONE SODIUM PHOSPHATE 10 MG/ML IJ SOLN
INTRAMUSCULAR | Status: DC | PRN
  Administered 2022-11-13: 10 mg via INTRAVENOUS

## 2022-11-13 MED ORDER — PROMETHAZINE HCL 25 MG/ML IJ SOLN: 6.2500 mg | INTRAMUSCULAR | Status: DC | PRN

## 2022-11-13 MED ORDER — ZOLPIDEM TARTRATE 5 MG PO TABS: 5.0000 mg | ORAL_TABLET | Freq: Every evening | ORAL | Status: DC | PRN

## 2022-11-13 MED ORDER — SODIUM CHLORIDE 0.9 % IR SOLN
Status: DC | PRN
  Administered 2022-11-13: 1000 mL

## 2022-11-13 MED ORDER — ACETAMINOPHEN 325 MG PO TABS: 650.0000 mg | ORAL_TABLET | ORAL | Status: DC | PRN

## 2022-11-13 MED ORDER — LACTATED RINGERS IV SOLN: INTRAVENOUS | Status: DC

## 2022-11-13 MED ORDER — MORPHINE SULFATE (PF) 2 MG/ML IV SOLN: 2.0000 mg | INTRAVENOUS | Status: DC | PRN

## 2022-11-13 MED ORDER — MICROFIBRILLAR COLL HEMOSTAT EX PADS
MEDICATED_PAD | CUTANEOUS | Status: DC | PRN
  Administered 2022-11-13: 1 via TOPICAL

## 2022-11-13 MED ORDER — ONDANSETRON HCL 4 MG/2ML IJ SOLN: 4.0000 mg | INTRAMUSCULAR | Status: DC | PRN

## 2022-11-13 MED ORDER — LACTATED RINGERS IV SOLN: INTRAVENOUS | Status: DC | PRN

## 2022-11-13 MED ORDER — MAGNESIUM CITRATE PO SOLN: 1.0000 | Freq: Once | ORAL | Status: DC

## 2022-11-13 MED ORDER — STERILE WATER FOR IRRIGATION IR SOLN
Status: DC | PRN
  Administered 2022-11-13: 1000 mL

## 2022-11-13 MED ORDER — ACETAMINOPHEN 500 MG PO TABS
1000.0000 mg | ORAL_TABLET | Freq: Once | ORAL | Status: AC
  Administered 2022-11-13: 1000 mg via ORAL
  Filled 2022-11-13: qty 2

## 2022-11-13 MED ORDER — KETOROLAC TROMETHAMINE 15 MG/ML IJ SOLN
15.0000 mg | Freq: Four times a day (QID) | INTRAMUSCULAR | Status: DC
  Administered 2022-11-13 – 2022-11-14 (×3): 15 mg via INTRAVENOUS
  Filled 2022-11-13 (×3): qty 1

## 2022-11-13 MED ORDER — POTASSIUM CHLORIDE IN NACL 20-0.45 MEQ/L-% IV SOLN
INTRAVENOUS | Status: DC
  Filled 2022-11-13 (×3): qty 1000

## 2022-11-13 MED ORDER — EPHEDRINE SULFATE (PRESSORS) 50 MG/ML IJ SOLN
INTRAMUSCULAR | Status: DC | PRN
  Administered 2022-11-13 (×2): 5 mg via INTRAVENOUS
  Administered 2022-11-13 (×3): 10 mg via INTRAVENOUS

## 2022-11-13 MED ORDER — LOSARTAN POTASSIUM 50 MG PO TABS
50.0000 mg | ORAL_TABLET | Freq: Every day | ORAL | Status: DC
  Administered 2022-11-14: 50 mg via ORAL
  Filled 2022-11-13: qty 1

## 2022-11-13 MED ORDER — PHENYLEPHRINE HCL (PRESSORS) 10 MG/ML IV SOLN
INTRAVENOUS | Status: DC | PRN
  Administered 2022-11-13 (×2): 80 ug via INTRAVENOUS

## 2022-11-13 MED ORDER — HYOSCYAMINE SULFATE 0.125 MG SL SUBL: 0.1250 mg | SUBLINGUAL_TABLET | Freq: Four times a day (QID) | SUBLINGUAL | Status: DC | PRN

## 2022-11-13 MED ORDER — DIPHENHYDRAMINE HCL 50 MG/ML IJ SOLN: 12.5000 mg | Freq: Four times a day (QID) | INTRAMUSCULAR | Status: DC | PRN

## 2022-11-13 MED ORDER — ONDANSETRON HCL 4 MG/2ML IJ SOLN
INTRAMUSCULAR | Status: AC
  Filled 2022-11-13: qty 2

## 2022-11-13 MED ORDER — PHENYLEPHRINE HCL-NACL 20-0.9 MG/250ML-% IV SOLN
INTRAVENOUS | Status: DC | PRN
  Administered 2022-11-13: 25 ug/min via INTRAVENOUS

## 2022-11-13 SURGICAL SUPPLY — 62 items
APPLICATOR COTTON TIP 6 STRL (MISCELLANEOUS) ×2 IMPLANT
APPLICATOR COTTON TIP 6IN STRL (MISCELLANEOUS) ×2
BAG COUNTER SPONGE SURGICOUNT (BAG) IMPLANT
CATH FOLEY 2WAY SLVR 18FR 30CC (CATHETERS) ×2 IMPLANT
CATH ROBINSON RED A/P 16FR (CATHETERS) ×2 IMPLANT
CATH ROBINSON RED A/P 8FR (CATHETERS) ×2 IMPLANT
CATH TIEMANN FOLEY 18FR 5CC (CATHETERS) ×2 IMPLANT
CHLORAPREP W/TINT 26 (MISCELLANEOUS) ×2 IMPLANT
CLIP LIGATING HEM O LOK PURPLE (MISCELLANEOUS) ×2 IMPLANT
COVER SURGICAL LIGHT HANDLE (MISCELLANEOUS) ×2 IMPLANT
COVER TIP SHEARS 8 DVNC (MISCELLANEOUS) ×2 IMPLANT
COVER TIP SHEARS 8MM DA VINCI (MISCELLANEOUS) ×2
CUTTER ECHEON FLEX ENDO 45 340 (ENDOMECHANICALS) ×2 IMPLANT
DERMABOND ADVANCED .7 DNX12 (GAUZE/BANDAGES/DRESSINGS) ×2 IMPLANT
DRAIN CHANNEL RND F F (WOUND CARE) IMPLANT
DRAPE ARM DVNC X/XI (DISPOSABLE) ×8 IMPLANT
DRAPE COLUMN DVNC XI (DISPOSABLE) ×2 IMPLANT
DRAPE DA VINCI XI ARM (DISPOSABLE) ×8
DRAPE DA VINCI XI COLUMN (DISPOSABLE) ×2
DRAPE SURG IRRIG POUCH 19X23 (DRAPES) ×2 IMPLANT
DRSG TEGADERM 4X4.75 (GAUZE/BANDAGES/DRESSINGS) ×2 IMPLANT
ELECT PENCIL ROCKER SW 15FT (MISCELLANEOUS) ×2 IMPLANT
ELECT REM PT RETURN 15FT ADLT (MISCELLANEOUS) ×2 IMPLANT
GAUZE 4X4 16PLY ~~LOC~~+RFID DBL (SPONGE) ×2 IMPLANT
GAUZE SPONGE 4X4 12PLY STRL (GAUZE/BANDAGES/DRESSINGS) ×2 IMPLANT
GLOVE BIO SURGEON STRL SZ 6.5 (GLOVE) ×2 IMPLANT
GLOVE SURG LX STRL 7.5 STRW (GLOVE) ×4 IMPLANT
GOWN SRG XL LVL 4 BRTHBL STRL (GOWNS) ×2 IMPLANT
GOWN STRL NON-REIN XL LVL4 (GOWNS) ×2
GOWN STRL REUS W/ TWL XL LVL3 (GOWN DISPOSABLE) ×4 IMPLANT
GOWN STRL REUS W/TWL XL LVL3 (GOWN DISPOSABLE) ×4
HOLDER FOLEY CATH W/STRAP (MISCELLANEOUS) ×2 IMPLANT
IRRIG SUCT STRYKERFLOW 2 WTIP (MISCELLANEOUS) ×2
IRRIGATION SUCT STRKRFLW 2 WTP (MISCELLANEOUS) ×2 IMPLANT
IV LACTATED RINGERS 1000ML (IV SOLUTION) ×2 IMPLANT
KIT TURNOVER KIT A (KITS) IMPLANT
NDL SAFETY ECLIP 18X1.5 (MISCELLANEOUS) ×2 IMPLANT
PACK ROBOT UROLOGY CUSTOM (CUSTOM PROCEDURE TRAY) ×2 IMPLANT
RELOAD STAPLE 45 4.1 GRN THCK (STAPLE) ×2 IMPLANT
SEAL CANN UNIV 5-8 DVNC XI (MISCELLANEOUS) ×8 IMPLANT
SEAL XI 5MM-8MM UNIVERSAL (MISCELLANEOUS) ×8
SET TUBE SMOKE EVAC HIGH FLOW (TUBING) ×2 IMPLANT
SOLUTION ELECTROLUBE (MISCELLANEOUS) ×2 IMPLANT
SPIKE FLUID TRANSFER (MISCELLANEOUS) ×2 IMPLANT
STAPLE RELOAD 45 GRN (STAPLE) ×2 IMPLANT
STAPLE RELOAD 45MM GREEN (STAPLE) ×2
SUT ETHILON 3 0 PS 1 (SUTURE) ×2 IMPLANT
SUT MNCRL 3 0 RB1 (SUTURE) ×2 IMPLANT
SUT MNCRL 3 0 VIOLET RB1 (SUTURE) ×2 IMPLANT
SUT MNCRL AB 4-0 PS2 18 (SUTURE) ×4 IMPLANT
SUT MONOCRYL 3 0 RB1 (SUTURE) ×4
SUT PDS PLUS 0 (SUTURE) ×4
SUT PDS PLUS AB 0 CT-2 (SUTURE) ×4 IMPLANT
SUT VIC AB 0 CT1 27 (SUTURE) ×4
SUT VIC AB 0 CT1 27XBRD ANTBC (SUTURE) ×4 IMPLANT
SUT VIC AB 2-0 SH 27 (SUTURE) ×4
SUT VIC AB 2-0 SH 27X BRD (SUTURE) ×2 IMPLANT
SYR 27GX1/2 1ML LL SAFETY (SYRINGE) ×2 IMPLANT
TOWEL OR NON WOVEN STRL DISP B (DISPOSABLE) ×2 IMPLANT
TROCAR Z THREAD OPTICAL 12X100 (TROCAR) IMPLANT
TROCAR Z-THREAD FIOS 5X100MM (TROCAR) IMPLANT
WATER STERILE IRR 1000ML POUR (IV SOLUTION) ×2 IMPLANT

## 2022-11-13 NOTE — Anesthesia Preprocedure Evaluation (Addendum)
Anesthesia Evaluation  Patient identified by MRN, date of birth, ID band Patient awake    Reviewed: Allergy & Precautions, NPO status , Patient's Chart, lab work & pertinent test results  Airway Mallampati: IV  TM Distance: >3 FB Neck ROM: Full    Dental  (+) Teeth Intact, Dental Advisory Given   Pulmonary sleep apnea ,    Pulmonary exam normal breath sounds clear to auscultation       Cardiovascular hypertension, Pt. on medications Normal cardiovascular exam Rhythm:Regular Rate:Normal     Neuro/Psych negative neurological ROS  negative psych ROS   GI/Hepatic Neg liver ROS, GERD  Medicated,  Endo/Other  diabetes, Type 2, Insulin Dependent, Oral Hypoglycemic AgentsObesity   Renal/GU negative Renal ROS   Prostate cancer     Musculoskeletal  (+) Arthritis ,   Abdominal   Peds  Hematology negative hematology ROS (+)   Anesthesia Other Findings Day of surgery medications reviewed with the patient.  Reproductive/Obstetrics                            Anesthesia Physical Anesthesia Plan  ASA: 3  Anesthesia Plan: General   Post-op Pain Management: Tylenol PO (pre-op)*   Induction: Intravenous  PONV Risk Score and Plan: 3 and Midazolam, Dexamethasone and Ondansetron  Airway Management Planned: Oral ETT and Video Laryngoscope Planned  Additional Equipment:   Intra-op Plan:   Post-operative Plan: Extubation in OR  Informed Consent: I have reviewed the patients History and Physical, chart, labs and discussed the procedure including the risks, benefits and alternatives for the proposed anesthesia with the patient or authorized representative who has indicated his/her understanding and acceptance.     Dental advisory given  Plan Discussed with: CRNA  Anesthesia Plan Comments: (2nd PIV)       Anesthesia Quick Evaluation

## 2022-11-13 NOTE — Discharge Instructions (Signed)

## 2022-11-13 NOTE — Op Note (Signed)
Preoperative diagnosis: Clinically localized adenocarcinoma of the prostate (clinical stage T1c N1 M0)  Postoperative diagnosis: Clinically localized adenocarcinoma of the prostate (clinical stage T1c N1 M0)  Procedure:  Robotic assisted laparoscopic radical prostatectomy (Bilateral nerve sparing) Bilateral robotic assisted laparoscopic extended pelvic lymphadenectomy  Surgeon: Pryor Curia. M.D.  Assistant(s): Debbrah Alar, PA-C  An assistant was required for this surgical procedure.  The duties of the assistant included but were not limited to suctioning, passing suture, camera manipulation, retraction. This procedure would not be able to be performed without an Environmental consultant.  Anesthesia: General  Complications: None  EBL: 800 mL  IVF:  2000 mL crystalloid  Specimens: Prostate and seminal vesicles Right pelvic lymph nodes Left pelvic lymph nodes Right common iliac lymph nodes  Disposition of specimens: Pathology  Drains: 20 Fr coude catheter # 19 Blake pelvic drain  Indication: Brian Beasley is a 62 y.o. patient with prostate cancer.  There was concern for lymph node positive disease on his preoperative PSMA PET scan.  After a thorough review of the management options for treatment of prostate cancer, he elected to proceed with surgical therapy and the above procedure(s).  We have discussed the potential benefits and risks of the procedure, side effects of the proposed treatment, the likelihood of the patient achieving the goals of the procedure, and any potential problems that might occur during the procedure or recuperation. Informed consent has been obtained.  Description of procedure:  The patient was taken to the operating room and a general anesthetic was administered. He was given preoperative antibiotics, placed in the dorsal lithotomy position, and prepped and draped in the usual sterile fashion. Next a preoperative timeout was performed. A urethral catheter  was placed into the bladder and a site was selected near the umbilicus for placement of the camera port. This was placed using a standard open Hassan technique which allowed entry into the peritoneal cavity under direct vision and without difficulty. An 8 mm port was placed and a pneumoperitoneum established. The camera was then used to inspect the abdomen and there was no evidence of any intra-abdominal injuries or other abnormalities. The remaining abdominal ports were then placed. 8 mm robotic ports were placed in the right lower quadrant, left lower quadrant, and far left lateral abdominal wall. A 5 mm port was placed in the right upper quadrant and a 12 mm port was placed in the right lateral abdominal wall for laparoscopic assistance. All ports were placed under direct vision without difficulty. The surgical cart was then docked.   Utilizing the cautery scissors, the bladder was reflected posteriorly allowing entry into the space of Retzius and identification of the endopelvic fascia and prostate. The periprostatic fat was then removed from the prostate allowing full exposure of the endopelvic fascia. The endopelvic fascia was then incised from the apex back to the base of the prostate bilaterally and the underlying levator muscle fibers were swept laterally off the prostate thereby isolating the dorsal venous complex. The dorsal vein was then stapled and divided with a 45 mm Flex Echelon stapler. Attention then turned to the bladder neck which was divided anteriorly thereby allowing entry into the bladder and exposure of the urethral catheter. The catheter balloon was deflated and the catheter was brought into the operative field and used to retract the prostate anteriorly. The posterior bladder neck was then examined and was divided allowing further dissection between the bladder and prostate posteriorly until the vasa deferentia and seminal vessels were identified.  The vasa deferentia were isolated,  divided, and lifted anteriorly. The seminal vesicles were dissected down to their tips with care to control the seminal vascular arterial blood supply. These structures were then lifted anteriorly and the space between Denonvillier's fascia and the anterior rectum was developed with a combination of sharp and blunt dissection. This isolated the vascular pedicles of the prostate.  The lateral prostatic fascia was then sharply incised allowing release of the neurovascular bundles bilaterally. The vascular pedicles of the prostate were then ligated with Weck clips between the prostate and neurovascular bundles and divided with sharp cold scissor dissection resulting in neurovascular bundle preservation. The neurovascular bundles were then separated off the apex of the prostate and urethra bilaterally.  The urethra was then sharply transected allowing the prostate specimen to be disarticulated. The pelvis was copiously irrigated and hemostasis was ensured. There was no evidence for rectal injury.  Attention then turned to the right pelvic sidewall. The fibrofatty tissue extending from the genitofemoral nerve laterally to the confluence of the iliac vessels proximally to the hypogastric artery posteriorly and Cooper's ligament distally was dissected free from the pelvic sidewall with care to preserve the obturator nerve and major vascular structures. Weck clips were used for lymphostasis and hemostasis. Care was taken to ensure the right common iliac lymph node seen on preoperative imaging was removed and an enlarged lymph node was identified in this area that correlated to his imagine.  This packet was sent separately as the right common iliac lymph nodes. An identical procedure was then performed on the contralateral side and the lymphatic packets were removed for permanent pathologic analysis.  Attention then turned to the urethral anastomosis. A 2-0 Vicryl slip knot was placed between Denonvillier's fascia,  the posterior bladder neck, and the posterior urethra to reapproximate these structures. A double-armed 3-0 Monocryl suture was then used to perform a 360 running tension-free anastomosis between the bladder neck and urethra. A new urethral catheter was then placed into the bladder and irrigated. There were no blood clots within the bladder and the anastomosis appeared to be watertight. A #19 Blake drain was then brought through the left lateral 8 mm port site and positioned appropriately within the pelvis. It was secured to the skin with a nylon suture. The surgical cart was then undocked. The right lateral 12 mm port site was closed at the fascial level with a 0 Vicryl suture placed laparoscopically. All remaining ports were then removed under direct vision. The prostate specimen was removed intact within the Endopouch retrieval bag via the periumbilical camera port site. This fascial opening was closed with two running 0 Vicryl sutures. 0.25% Marcaine was then injected into all port sites and all incisions were reapproximated at the skin level with 4-0 Monocryl subcuticular sutures. Dermabond was applied. The patient appeared to tolerate the procedure well and without complications. The patient was able to be extubated and transferred to the recovery unit in satisfactory condition.  Pryor Curia MD

## 2022-11-13 NOTE — Anesthesia Postprocedure Evaluation (Signed)
Anesthesia Post Note  Patient: Brian Beasley  Procedure(s) Performed: XI ROBOTIC ASSISTED LAPAROSCOPIC RADICAL PROSTATECTOMY LEVEL 2 (Abdomen) BLILATERAL PELVIC LYMPHADENECTOMY (Bilateral: Abdomen)     Patient location during evaluation: PACU Anesthesia Type: General Level of consciousness: awake and alert Pain management: pain level controlled Vital Signs Assessment: post-procedure vital signs reviewed and stable Respiratory status: spontaneous breathing, nonlabored ventilation, respiratory function stable and patient connected to nasal cannula oxygen Cardiovascular status: blood pressure returned to baseline and stable Postop Assessment: no apparent nausea or vomiting Anesthetic complications: yes   Encounter Notable Events  Notable Event Outcome Phase Comment  Difficult to intubate - expected  Intraprocedure Filed from anesthesia note documentation.    Last Vitals:  Vitals:   11/13/22 1700 11/13/22 1726  BP: (!) 140/73 (!) 144/75  Pulse: 81 75  Resp:  18  Temp:  (!) 36.3 C  SpO2: 95% 96%    Last Pain:  Vitals:   11/13/22 1735  TempSrc:   PainSc: 0-No pain                 Santa Lighter

## 2022-11-13 NOTE — Interval H&P Note (Signed)
History and Physical Interval Note:  11/13/2022 10:29 AM  Brian Beasley  has presented today for surgery, with the diagnosis of PROSTATE CANCER.  The various methods of treatment have been discussed with the patient and family. After consideration of risks, benefits and other options for treatment, the patient has consented to  Procedure(s): XI ROBOTIC ASSISTED LAPAROSCOPIC RADICAL PROSTATECTOMY LEVEL 2 (N/A) BLILATERAL PELVIC LYMPHADENECTOMY (Bilateral) as a surgical intervention.  The patient's history has been reviewed, patient examined, no change in status, stable for surgery.  I have reviewed the patient's chart and labs.  Questions were answered to the patient's satisfaction.     [employer]

## 2022-11-13 NOTE — Anesthesia Procedure Notes (Signed)
Procedure Name: Intubation Date/Time: 11/13/2022 11:23 AM  Performed by: Randye Lobo, CRNAPre-anesthesia Checklist: Patient identified, Emergency Drugs available, Suction available and Patient being monitored Patient Re-evaluated:Patient Re-evaluated prior to induction Oxygen Delivery Method: Circle system utilized Preoxygenation: Pre-oxygenation with 100% oxygen Induction Type: IV induction Ventilation: Mask ventilation without difficulty Laryngoscope Size: Glidescope and 4 Grade View: Grade II Tube type: Oral Tube size: 8.0 mm Number of attempts: 1 Airway Equipment and Method: Stylet Placement Confirmation: ETT inserted through vocal cords under direct vision, positive ETCO2 and breath sounds checked- equal and bilateral Tube secured with: Tape Dental Injury: Teeth and Oropharynx as per pre-operative assessment  Difficulty Due To: Difficulty was anticipated

## 2022-11-13 NOTE — Progress Notes (Signed)
Patient ID: Brian Beasley, male   DOB: 12/28/1960, 62 y.o.   MRN: [id number]  Post-op note  Subjective: The patient is doing well.  No complaints.  Objective: Vital signs in last 24 hours: Temp:  [97.4 F (36.3 C)-98.2 F (36.8 C)] 97.4 F (36.3 C) (01/18 1726) Pulse Rate:  [68-87] 75 (01/18 1726) Resp:  [15-21] 18 (01/18 1726) BP: (108-150)/(58-75) 144/75 (01/18 1726) SpO2:  [93 %-100 %] 96 % (01/18 1726) Weight:  [126.7 kg] 126.7 kg (01/18 0921)  Intake/Output from previous day: No intake/output data recorded. Intake/Output this shift: Total I/O In: 2350 [I.V.:2000; IV Piggyback:350] Out: 995 [Urine:125; Drains:70; Blood:800]  Physical Exam:  General: Alert and oriented. Abdomen: Soft, Nondistended. Incisions: Clean and dry. GU: Urine clearing.  Lab Results: Recent Labs    11/13/22 1452  HGB 13.3  HCT 40.0    Assessment/Plan: POD#0   1) Continue to monitor, ambulate, IS   Brian Beasley. MD   LOS: 0 days   Brian Beasley 11/13/2022, 5:39 PM

## 2022-11-13 NOTE — Transfer of Care (Signed)
Immediate Anesthesia Transfer of Care Note  Patient: Brian Beasley  Procedure(s) Performed: XI ROBOTIC ASSISTED LAPAROSCOPIC RADICAL PROSTATECTOMY LEVEL 2 (Abdomen) BLILATERAL PELVIC LYMPHADENECTOMY (Bilateral: Abdomen)  Patient Location: PACU  Anesthesia Type:General  Level of Consciousness: awake, alert , oriented, and patient cooperative  Airway & Oxygen Therapy: Patient Spontanous Breathing and Patient connected to face mask oxygen  Post-op Assessment: Report given to RN, Post -op Vital signs reviewed and stable, and Patient moving all extremities  Post vital signs: Reviewed and stable  Last Vitals:  Vitals Value Taken Time  BP 108/58 11/13/22 1445  Temp 36.4 C 11/13/22 1439  Pulse 81 11/13/22 1446  Resp 19 11/13/22 1446  SpO2 96 % 11/13/22 1446  Vitals shown include unvalidated device data.  Last Pain:  Vitals:   11/13/22 1439  TempSrc:   PainSc: 0-No pain      Patients Stated Pain Goal: 5 (61/47/09 2957)  Complications:  Encounter Notable Events  Notable Event Outcome Phase Comment  Difficult to intubate - expected  Intraprocedure Filed from anesthesia note documentation.

## 2022-11-14 ENCOUNTER — Encounter (HOSPITAL_COMMUNITY): Payer: Self-pay | Admitting: Urology

## 2022-11-14 DIAGNOSIS — C61 Malignant neoplasm of prostate: Secondary | ICD-10-CM | POA: Diagnosis not present

## 2022-11-14 LAB — GLUCOSE, CAPILLARY
Glucose-Capillary: 114 mg/dL — ABNORMAL HIGH (ref 70–99)
Glucose-Capillary: 128 mg/dL — ABNORMAL HIGH (ref 70–99)
Glucose-Capillary: 140 mg/dL — ABNORMAL HIGH (ref 70–99)
Glucose-Capillary: 164 mg/dL — ABNORMAL HIGH (ref 70–99)

## 2022-11-14 LAB — HEMOGLOBIN AND HEMATOCRIT, BLOOD
HCT: 36.1 % — ABNORMAL LOW (ref 39.0–52.0)
Hemoglobin: 12.2 g/dL — ABNORMAL LOW (ref 13.0–17.0)

## 2022-11-14 MED ORDER — TRAMADOL HCL 50 MG PO TABS: 50.0000 mg | ORAL_TABLET | Freq: Four times a day (QID) | ORAL | Status: DC | PRN

## 2022-11-14 MED ORDER — BISACODYL 10 MG RE SUPP
10.0000 mg | Freq: Once | RECTAL | Status: AC
  Administered 2022-11-14: 10 mg via RECTAL
  Filled 2022-11-14: qty 1

## 2022-11-14 NOTE — Discharge Summary (Signed)
  Date of admission: 11/13/2022  Date of discharge: 11/14/2022  Admission diagnosis: Prostate Cancer  Discharge diagnosis: Prostate Cancer  History and Physical: For full details, please see admission history and physical. Briefly, Brian Beasley is a 62 y.o. gentleman with localized prostate cancer.  After discussing management/treatment options, he elected to proceed with surgical treatment.  Hospital Course: Brian Beasley was taken to the operating room on 11/13/2022 and underwent a robotic assisted laparoscopic radical prostatectomy. He tolerated this procedure well and without complications. Postoperatively, he was able to be transferred to a regular hospital room following recovery from anesthesia.  He was able to begin ambulating the night of surgery. He remained hemodynamically stable overnight.  He had excellent urine output with appropriately minimal output from his pelvic drain and his pelvic drain was removed on POD #1.  He was transitioned to oral pain medication, tolerated a clear liquid diet, and had met all discharge criteria and was able to be discharged home later on POD#1.  Laboratory values:  Recent Labs    11/13/22 1452 11/14/22 0501  HGB 13.3 12.2*  HCT 40.0 36.1*    Disposition: Home  Discharge instruction: He was instructed to be ambulatory but to refrain from heavy lifting, strenuous activity, or driving. He was instructed on urethral catheter care.  Discharge medications:   Allergies as of 11/14/2022       Reactions   Lisinopril Cough   Welchol [colesevelam Hcl] Diarrhea, Nausea Only   Zocor [simvastatin]    Elevated LFTs        Medication List     TAKE these medications    docusate sodium 100 MG capsule Commonly known as: COLACE Take 1 capsule (100 mg total) by mouth 2 (two) times daily.   fluticasone 50 MCG/ACT nasal spray Commonly known as: FLONASE Place 2 sprays into both nostrils daily.   hydrochlorothiazide 12.5 MG tablet Commonly known  as: HYDRODIURIL Take 12.5 mg by mouth daily.   insulin detemir 100 UNIT/ML FlexPen Commonly known as: LEVEMIR Inject 10 Units into the skin at bedtime.   losartan 50 MG tablet Commonly known as: COZAAR Take 50 mg by mouth daily.   metFORMIN 1000 MG tablet Commonly known as: GLUCOPHAGE Take 1,000 mg by mouth 2 (two) times daily with a meal.   NON FORMULARY Pt uses a cpap nightly   omeprazole 20 MG tablet Commonly known as: PRILOSEC OTC Take 20 mg by mouth daily.   rosuvastatin 5 MG tablet Commonly known as: CRESTOR Take 5 mg by mouth daily.   sulfamethoxazole-trimethoprim 800-160 MG tablet Commonly known as: BACTRIM DS Take 1 tablet by mouth 2 (two) times daily. Start the day prior to foley removal appointment   tadalafil 5 MG tablet Commonly known as: CIALIS Take 5 mg by mouth daily.   traMADol 50 MG tablet Commonly known as: Ultram Take 1-2 tablets (50-100 mg total) by mouth every 6 (six) hours as needed for moderate pain or severe pain.   Trulicity 1.5 WN/0.2VO Sopn Generic drug: Dulaglutide Inject 1.5 mg into the skin every Friday.        Followup: He will followup in 1 week for catheter removal and to discuss his surgical pathology results.

## 2022-11-14 NOTE — TOC Initial Note (Signed)
Transition of Care [facility]) - Initial/Assessment Note    Patient Details  Name: Brian Beasley MRN: [id number] Date of Birth: [birth year]  Transition of Care [facility]) CM/SW Contact:    Leeroy Cha, RN Phone Number: 11/14/2022, 10:09 AM  Clinical Narrative:                  Transition of Care [facility]) Screening Note   Patient Details  Name: Brian Beasley Date of Birth: Mar 30, 1961   Transition of Care [facility]) CM/SW Contact:    Leeroy Cha, RN Phone Number: 11/14/2022, 10:09 AM    Transition of Care Department (TOC) has reviewed patient and no TOC needs have been identified at this time. We will continue to monitor patient advancement through interdisciplinary progression rounds. If new patient transition needs arise, please place a TOC consult.    Expected Discharge Plan: Home/Self Care Barriers to Discharge: Continued Medical Work up   Patient Goals and CMS Choice Patient states their goals for this hospitalization and ongoing recovery are:: to go home CMS Medicare.gov Compare Post Acute Care list provided to:: Patient        Expected Discharge Plan and Services   Discharge Planning Services: CM Consult   Living arrangements for the past 2 months: Single Family Home                                      Prior Living Arrangements/Services Living arrangements for the past 2 months: Single Family Home Lives with:: Spouse Patient language and need for interpreter reviewed:: Yes Do you feel safe going back to the place where you live?: Yes            Criminal Activity/Legal Involvement Pertinent to Current Situation/Hospitalization: No - Comment as needed  Activities of Daily Living Home Assistive Devices/Equipment: CPAP, CBG Meter ADL Screening (condition at time of admission) Patient's cognitive ability adequate to safely complete daily activities?: Yes Is the patient deaf or have difficulty hearing?: No Does the patient have difficulty seeing,  even when wearing glasses/contacts?: No Does the patient have difficulty concentrating, remembering, or making decisions?: No Patient able to express need for assistance with ADLs?: Yes Does the patient have difficulty dressing or bathing?: No Independently performs ADLs?: Yes (appropriate for developmental age) Does the patient have difficulty walking or climbing stairs?: No Weakness of Legs: None Weakness of Arms/Hands: None  Permission Sought/Granted                  Emotional Assessment Appearance:: Appears stated age Attitude/Demeanor/Rapport: Engaged Affect (typically observed): Appropriate, Calm Orientation: : Oriented to Self, Oriented to Place, Oriented to  Time, Oriented to Situation Alcohol / Substance Use: Not Applicable Psych Involvement: No (comment)  Admission diagnosis:  Prostate cancer [facility]) [C61] Patient Active Problem List   Diagnosis Date Noted   Prostate cancer ([place]) 11/13/2022   OSA (obstructive sleep apnea) 12/26/2013   Obesity 12/26/2013   Elevated blood pressure reading without diagnosis of hypertension 12/26/2013   Chronic cholecystitis 11/21/2013   PCP:  Lujean Amel, MD Pharmacy:   [facility] [place], [address] [place] [address] Phone: [phone] Fax: [phone]     Social Determinants of Health (SDOH) Social History: SDOH Screenings   Food Insecurity: No Food Insecurity (11/13/2022)  Housing: Low Risk  (11/13/2022)  Transportation Needs: No Transportation Needs (11/13/2022)  Utilities: Not At Risk (11/13/2022)  Tobacco Use: High Risk (11/14/2022)   SDOH Interventions:     Readmission Risk Interventions   No data to display

## 2022-11-14 NOTE — TOC Transition Note (Signed)
Transition of Care [facility]) - CM/SW Discharge Note   Patient Details  Name: Brian Beasley MRN: [id number] Date of Birth: Aug 08, 1961  Transition of Care [facility]) CM/SW Contact:  Leeroy Cha, RN Phone Number: 11/14/2022, 12:04 PM   Clinical Narrative:    Pt discharged to home with self care. No toc needs present.   Final next level of care: Home/Self Care Barriers to Discharge: Barriers Resolved   Patient Goals and CMS Choice CMS Medicare.gov Compare Post Acute Care list provided to:: Patient    Discharge Placement                         Discharge Plan and Services Additional resources added to the After Visit Summary for     Discharge Planning Services: CM Consult                                 Social Determinants of Health (SDOH) Interventions SDOH Screenings   Food Insecurity: No Food Insecurity (11/13/2022)  Housing: Low Risk  (11/13/2022)  Transportation Needs: No Transportation Needs (11/13/2022)  Utilities: Not At Risk (11/13/2022)  Tobacco Use: High Risk (11/14/2022)     Readmission Risk Interventions   No data to display

## 2022-11-14 NOTE — Progress Notes (Signed)
Patient ID: Brian Beasley, male   DOB: [birth year], 62 y.o.   MRN: [id number]  1 Day Post-Op Subjective: The patient is doing well.  No nausea or vomiting. Pain is adequately controlled.  Objective: Vital signs in last 24 hours: Temp:  [97.4 F (36.3 C)-99.3 F (37.4 C)] 98.5 F (36.9 C) (01/19 0427) Pulse Rate:  [68-104] 72 (01/19 0427) Resp:  [15-21] 18 (01/19 0427) BP: (106-150)/(58-75) 115/62 (01/19 0427) SpO2:  [93 %-100 %] 94 % (01/19 0427) Weight:  [126.7 kg] 126.7 kg (01/18 0921)  Intake/Output from previous day: 01/18 0701 - 01/19 0700 In: 4245 [P.O.:50; I.V.:3745; IV Piggyback:450] Out: 2717 [Urine:1675; Drains:242; Blood:800] Intake/Output this shift: No intake/output data recorded.  Physical Exam:  General: Alert and oriented. CV: RRR Lungs: Clear bilaterally. GI: Soft, Nondistended. Incisions: Clean, dry, and intact Urine: Clear Extremities: Nontender, no erythema, no edema.  Lab Results: Recent Labs    11/13/22 1452 11/14/22 0501  HGB 13.3 12.2*  HCT 40.0 36.1*      Assessment/Plan: POD# 1 s/p robotic prostatectomy.  1) SL IVF 2) Ambulate, Incentive spirometry 3) Transition to oral pain medication 4) Dulcolax suppository 5) D/C pelvic drain 6) Plan for likely discharge later today   Pryor Curia. MD   LOS: 0 days   Dutch Gray 11/14/2022, 7:43 AM

## 2022-11-14 NOTE — Plan of Care (Signed)
  Problem: Urinary Elimination: Goal: Ability to avoid or minimize complications of infection will improve Outcome: Progressing Goal: Home care management will improve Outcome: Progressing   Problem: Education: Goal: Knowledge of the procedure and recovery process will improve Outcome: Adequate for Discharge

## 2022-11-20 LAB — SURGICAL PATHOLOGY

## 2023-04-08 ENCOUNTER — Encounter (HOSPITAL_COMMUNITY): Payer: Self-pay | Admitting: Urology

## 2023-04-09 ENCOUNTER — Other Ambulatory Visit (HOSPITAL_COMMUNITY): Payer: Self-pay | Admitting: Urology

## 2023-04-09 DIAGNOSIS — C61 Malignant neoplasm of prostate: Secondary | ICD-10-CM

## 2023-04-09 DIAGNOSIS — R9721 Rising PSA following treatment for malignant neoplasm of prostate: Secondary | ICD-10-CM

## 2023-04-27 ENCOUNTER — Ambulatory Visit (HOSPITAL_COMMUNITY)
Admission: RE | Admit: 2023-04-27 | Discharge: 2023-04-27 | Disposition: A | Discharge status: Home / Self Care | Payer: BC Managed Care – PPO | Source: Ambulatory Visit | Attending: Urology | Admitting: Urology

## 2023-04-27 DIAGNOSIS — R9721 Rising PSA following treatment for malignant neoplasm of prostate: Secondary | ICD-10-CM

## 2023-04-27 DIAGNOSIS — C61 Malignant neoplasm of prostate: Secondary | ICD-10-CM | POA: Diagnosis present

## 2023-04-27 MED ORDER — PIFLIFOLASTAT F 18 (PYLARIFY) INJECTION
9.0000 | Freq: Once | INTRAVENOUS | Status: AC
  Administered 2023-04-27: 8.603 via INTRAVENOUS

## 2023-06-04 ENCOUNTER — Telehealth: Payer: Self-pay

## 2023-06-04 NOTE — Telephone Encounter (Signed)
I called pt to introduce myself as the Coordinator of the Prostate MDC.   1. I confirmed with the patient he is aware of his referral to the clinic 8/27, arriving @ 12:15 pm.    2. I discussed the format of the clinic and the physicians he will be seeing that day.   3. I discussed where the clinic is located and how to contact me.   4. I confirmed his address and informed him I would be mailing a packet of information and forms to be completed. I asked him to bring them with him the day of his appointment.    He voiced understanding of the above. I asked him to call me if he has any questions or concerns regarding his appointments or the forms he needs to complete.

## 2023-06-23 ENCOUNTER — Inpatient Hospital Stay: Payer: BC Managed Care – PPO | Attending: Radiation Oncology | Admitting: Genetic Counselor

## 2023-06-23 ENCOUNTER — Encounter: Payer: Self-pay | Admitting: Radiation Oncology

## 2023-06-23 ENCOUNTER — Ambulatory Visit
Admission: RE | Admit: 2023-06-23 | Discharge: 2023-06-23 | Disposition: A | Discharge status: Home / Self Care | Payer: BC Managed Care – PPO | Source: Ambulatory Visit | Attending: Radiation Oncology | Admitting: Radiation Oncology

## 2023-06-23 ENCOUNTER — Inpatient Hospital Stay: Payer: BC Managed Care – PPO

## 2023-06-23 ENCOUNTER — Other Ambulatory Visit: Payer: Self-pay | Admitting: Genetic Counselor

## 2023-06-23 VITALS — BP 148/88 | HR 102 | Temp 97.6°F | Resp 20 | Ht 73.0 in | Wt 281.2 lb

## 2023-06-23 DIAGNOSIS — C61 Malignant neoplasm of prostate: Secondary | ICD-10-CM

## 2023-06-23 DIAGNOSIS — Z1379 Encounter for other screening for genetic and chromosomal anomalies: Secondary | ICD-10-CM

## 2023-06-23 DIAGNOSIS — Z8042 Family history of malignant neoplasm of prostate: Secondary | ICD-10-CM | POA: Diagnosis not present

## 2023-06-23 LAB — GENETIC SCREENING ORDER

## 2023-06-23 NOTE — Progress Notes (Signed)
Radiation Oncology         [phone] ________________________________  Multidisciplinary Prostate Cancer Clinic  Initial Radiation Oncology Consultation  Name: Brian Beasley MRN: [id number]  Date: 06/23/2023  DOB: 05-Mar-1961  [id number], D[place]as, MD  Heloise Purpura, MD   REFERRING PHYSICIAN: Heloise Purpura, MD  DIAGNOSIS: 62 y.o. gentleman with a rising, detectable PSA of 0.93 s/p RALP 10/2022 for Stage pT3bN1, Gleason 4+5 prostate cancer    ICD-10-CM   1. Malignant neoplasm of prostate (HCC)  C61     2. Prostate cancer [facility])  C61       HISTORY OF PRESENT ILLNESS: ADARRYLL Beasley is a 62 y.o. male with a diagnosis of prostate cancer. He was initially referred to Dr. Lafonda Mosses for an elevated PSA of 9.86 and subsequently diagnosed with Gleason 4+3 prostate cancer on biopsy 09/15/22 involving 4 of 12 cores. He underwent a staging PSMA PET scan on 10/17/22 showing a single radiotracer-avid lymph node in the right pelvis, in addition to activity within the prostate.     He elected to proceed with RALP on 11/13/22 under the care of Dr. Laverle Patter and final surgical pathology revealed Gleason 4+5 prostatic adenocarcinoma with extraprostatic extension at the right posterior mid and base, extensive right seminal vesicle invasion, and lymphovascular space invasion. Additionally, 5 of 21 sampled lymph nodes were positive for metastatic carcinoma, with extranodal extension noted. His initial postoperative PSA on 02/04/2023 was 0.37.        A repeat PSA 03/31/2023 had increased to 0.53. This prompted a restaging PSMA PET scan that was performed on 04/27/23 showing no residual cancer in the prostatectomy bed; interval resolution of radiotracer activity associated with the solitary right metastatic iliac lymph node and no evidence of metastatic adenopathy, visceral metastasis, or skeletal metastasis.  The patient reviewed the pathology and imaging results with his urologist and he has kindly been referred  today to the multidisciplinary prostate cancer clinic for presentation of pathology and radiology studies in our conference for discussion of potential radiation treatment options and clinical evaluation.   PREVIOUS RADIATION THERAPY: No  PAST MEDICAL HISTORY:  Past Medical History:  Diagnosis Date   Allergic rhinitis    Allergy    Arthritis    Cancer (HCC)    skin - basil cell   Diabetes mellitus without complication (HCC)    Family history of colon cancer    Father   GERD (gastroesophageal reflux disease)    Heart murmur    In teens, no murmur heard since   Hyperlipidemia    Hypertension    OSA (obstructive sleep apnea)    PSG 10/2008 AHI 26.4/hr   Prostate cancer (HCC)    Sleep apnea       PAST SURGICAL HISTORY: Past Surgical History:  Procedure Laterality Date   BASAL CELL CARCINOMA EXCISION     right cheek   CHOLECYSTECTOMY N/A 12/01/2013   Procedure: LAPAROSCOPIC CHOLECYSTECTOMY WITH INTRAOPERATIVE CHOLANGIOGRAM;  Surgeon: Wilmon Arms. Tsuei, MD;  Location: [facility];  Service: General;  Laterality: N/A;   HAND SURGERY     L hand, had 3 screws put in to repair bone   KNEE SURGERY Right    LYMPHADENECTOMY Bilateral 11/13/2022   Procedure: BLILATERAL PELVIC LYMPHADENECTOMY;  Surgeon: Heloise Purpura, MD;  Location: [facility];  Service: Urology;  Laterality: Bilateral;   NASAL SINUS SURGERY     opened passages to improve drainage   PROSTATE BIOPSY     ROBOT ASSISTED LAPAROSCOPIC RADICAL PROSTATECTOMY N/A 11/13/2022  Procedure: XI ROBOTIC ASSISTED LAPAROSCOPIC RADICAL PROSTATECTOMY LEVEL 2;  Surgeon: Heloise Purpura, MD;  Location: [facility];  Service: Urology;  Laterality: N/A;    FAMILY HISTORY:  Family History  Problem Relation Age of Onset   Heart disease Mother    Diabetes Mother    Colon cancer Father    Esophageal cancer Neg Hx    Rectal cancer Neg Hx    Stomach cancer Neg Hx     SOCIAL HISTORY:  Social History   Socioeconomic History   Marital status: Married     Spouse name: Not on file   Number of children: Not on file   Years of education: Not on file   Highest education level: Not on file  Occupational History   Not on file  Tobacco Use   Smoking status: Never   Smokeless tobacco: Current    Types: Chew  Vaping Use   Vaping status: Never Used  Substance and Sexual Activity   Alcohol use: Yes    Comment: few alcoholic beverages per week   Drug use: No   Sexual activity: Yes  Other Topics Concern   Not on file  Social History Narrative   Not on file   Social Determinants of Health   Financial Resource Strain: Not on file  Food Insecurity: No Food Insecurity (11/13/2022)   Hunger Vital Sign    Worried About Running Out of Food in the Last Year: Never true    Ran Out of Food in the Last Year: Never true  Transportation Needs: No Transportation Needs (11/13/2022)   PRAPARE - Administrator, Civil Service (Medical): No    Lack of Transportation (Non-Medical): No  Physical Activity: Not on file  Stress: Not on file  Social Connections: Not on file  Intimate Partner Violence: Not At Risk (11/13/2022)   Humiliation, Afraid, Rape, and Kick questionnaire    Fear of Current or Ex-Partner: No    Emotionally Abused: No    Physically Abused: No    Sexually Abused: No    ALLERGIES: Lisinopril, Welchol [colesevelam hcl], and Zocor [simvastatin]  MEDICATIONS:  Current Outpatient Medications  Medication Sig Dispense Refill   docusate sodium (COLACE) 100 MG capsule Take 1 capsule (100 mg total) by mouth 2 (two) times daily.     fluticasone (FLONASE) 50 MCG/ACT nasal spray Place 2 sprays into both nostrils daily.     glucose blood (ONETOUCH VERIO) test strip USE AS DIRECTE TWICE DAILY for 90     hydrochlorothiazide (HYDRODIURIL) 12.5 MG tablet Take 12.5 mg by mouth daily.     insulin detemir (LEVEMIR) 100 UNIT/ML FlexPen Inject 10 Units into the skin at bedtime. Patient reports not currently taking this medication.     Lancets  (ONETOUCH DELICA PLUS LANCET33G) MISC TEST TWICE DAILY AS DIRECTED for 100     losartan (COZAAR) 50 MG tablet Take 50 mg by mouth daily.     metFORMIN (GLUCOPHAGE) 1000 MG tablet Take 1,000 mg by mouth 2 (two) times daily with a meal.     NON FORMULARY Pt uses a cpap nightly     omeprazole (PRILOSEC OTC) 20 MG tablet Take 20 mg by mouth daily.     rosuvastatin (CRESTOR) 5 MG tablet Take 5 mg by mouth daily.     tadalafil (CIALIS) 5 MG tablet Take 5 mg by mouth daily.     No current facility-administered medications for this encounter.    REVIEW OF SYSTEMS:  On review of systems, the  patient reports that he is doing well overall. He denies any chest pain, shortness of breath, cough, fevers, chills, night sweats, unintended weight changes. He denies any bowel disturbances, and denies abdominal pain, nausea or vomiting. He denies any new musculoskeletal or joint aches or pains. His IPSS was 5, indicating mild urinary symptoms.  He is still making some progress with regards to regaining bladder continence and is still wearing 1 pad per day at this point.  His SHIM was 5, indicating he likely has postoperative erectile dysfunction. A complete review of systems is obtained and is otherwise negative.    PHYSICAL EXAM:  Wt Readings from Last 3 Encounters:  06/23/23 281 lb 3.2 oz (127.6 kg)  11/13/22 279 lb 6.4 oz (126.7 kg)  11/04/22 279 lb 6.4 oz (126.7 kg)   Temp Readings from Last 3 Encounters:  06/23/23 97.6 F (36.4 C)  11/14/22 98.5 F (36.9 C)  11/04/22 98.5 F (36.9 C) (Oral)   BP Readings from Last 3 Encounters:  06/23/23 (!) 148/88  11/14/22 115/62  11/04/22 (!) 150/90   Pulse Readings from Last 3 Encounters:  06/23/23 (!) 102  11/14/22 72  11/04/22 95    /10  In general this is a well appearing Caucasian man in no acute distress. He's alert and oriented x4 and appropriate throughout the examination. Cardiopulmonary assessment is negative for acute distress, and he exhibits  normal effort.     KPS = 100  100 - Normal; no complaints; no evidence of disease. 90   - Able to carry on normal activity; minor signs or symptoms of disease. 80   - Normal activity with effort; some signs or symptoms of disease. 46   - Cares for self; unable to carry on normal activity or to do active work. 60   - Requires occasional assistance, but is able to care for most of his personal needs. 50   - Requires considerable assistance and frequent medical care. 40   - Disabled; requires special care and assistance. 30   - Severely disabled; hospital admission is indicated although death not imminent. 20   - Very sick; hospital admission necessary; active supportive treatment necessary. 10   - Moribund; fatal processes progressing rapidly. 0     - Dead  Karnofsky DA, Abelmann WH, Craver LS and Burchenal [facility] [phone]) The use of the nitrogen mustards in the palliative treatment of carcinoma: with particular reference to bronchogenic carcinoma Cancer 1 634-56  LABORATORY DATA:  Lab Results  Component Value Date   WBC 7.9 11/04/2022   HGB 12.2 (L) 11/14/2022   HCT 36.1 (L) 11/14/2022   MCV 94.7 11/04/2022   PLT 208 11/04/2022   Lab Results  Component Value Date   NA 140 11/04/2022   K 4.3 11/04/2022   CL 106 11/04/2022   CO2 25 11/04/2022   Lab Results  Component Value Date   ALT 96 (H) 11/05/2013   AST 96 (H) 11/05/2013   ALKPHOS 90 11/05/2013   BILITOT 0.5 11/05/2013     RADIOGRAPHY: No results found.    IMPRESSION/PLAN: 1. 62 y.o. gentleman with a rising PSA of 0.93 s/p RALP 10/2022 for Stage pT3bN1, Gleason 4+5 prostate cancer Today, we reviewed the findings and workup thus far.  We discussed the natural history of prostate cancer.  We reviewed the the implications of positive margins, extracapsular extension, and seminal vesicle involvement on the risk of prostate cancer recurrence. In his case, extraprostatic extension, right seminal vesicle invasion, and positive lymph  nodes were present in addition to a rising, detectable postoperative PSA. We reviewed some of the evidence suggesting an advantage for patients who undergo salvage radiotherapy in the setting in terms of disease control and overall survival. We discussed radiation treatment directed to the prostatic fossa with regard to the logistics and delivery of external beam radiation treatment.  At the conclusion of our conversation, the patient is interested in moving forward with recommended 7.5 week course of of adjuvant external beam therapy concurrent with ADT. He has not yet started ADT so we will share our discussion with Dr. Laverle Patter and make arrangements for a follow-up visit in his office, first available, to start ADT now.  We will delay the start of daily external beam radiation to allow for some additional time to continue working with physical therapy to regain bladder continence and would anticipate moving forward with CTsimulation in October or November 2024. The patient appears to have a good understanding of his disease and our treatment recommendations which are of curative intent and is in agreement with the stated plan.  Therefore, we will move forward with treatment planning accordingly, in anticipation of beginning IMRT in the near future once he is fully recovered from surgery and is satisfied with his postoperative continence.  We enjoyed meeting him and his wife, Jasmine December, today and look forward to continuing to participate in his care.   We personally spent 60 minutes in this encounter including chart review, reviewing radiological studies, meeting face-to-face with the patient, entering orders and completing documentation.    Marguarite Arbour, PA-C    Margaretmary Dys, MD  [facility]  Radiation Oncology Direct Dial: [phone]  Fax: [phone] [email or web address]  Skype  LinkedIn   This document serves as a record of services personally performed by Margaretmary Dys, MD and Marcello Fennel, PA-C. It was created on their behalf by Mickie Bail, a trained medical scribe. The creation of this record is based on the scribe's personal observations and the provider's statements to them. This document has been checked and approved by the attending provider.

## 2023-06-23 NOTE — Consult Note (Signed)
Multi-Disciplinary Clinic     06/23/2023   --------------------------------------------------------------------------------   Brian Beasley  MRN: [id number]  DOB: 1961/01/30, 62 year old Male  SSN:    PRIMARY CARE:     REFERRING:  Brian Ditto L. Lafonda Mosses, MD  PROVIDER:  Heloise Beasley, M.D.  LOCATION:  [facility] [phone]     --------------------------------------------------------------------------------   CC/HPI: CC: Prostate Cancer   PCP:  Location of consult: [facility] - Prostate Cancer Multidisciplinary Clinic   Mr. Brian Beasley is a 62 year old gentleman who presented with an elevated PSA of 9.86. He underwent a TRUS biopsy of the prostate on 09/15/22 that confirmed Gleason 4+3=7 adenocarcinoma in 4 out of 12 biopsy cores. PSMA PET scan imaging in December 2023 indicated a single 7 mm right external iliac lymph nodes with uptake with no other evidence of metastatic disease. He elected primary surgical therapy and underwent a BNS RAL radical prostatectomy on 11/13/22. Pathology indicated a pT3b N1 Mx, Gleason 4+5=9 adenocarcinoma with negative surgical margins but 5 out of 21 lymph nodes positive for malignancy. His initial PSA in April 2024 was 0.37 and this increased to 0.53 when repeated on 03/31/23. He has had some delay in recovering his incontinence and was still using 2 pads per day in June. According to his physical therapist he has continued to drink a large amount of caffeinated beverages and has not been as compliant as he should with his pelvic exercises. He did undergo a PSMA PET scan on 05/06/23 for further evaluation and this demonstrated no obvious metastatic disease. The previously seen lymph node was removed. He was down to 1 pad per day in early July.   He is currently down to 1 pad per day. He still has incontinence and this continues to very slowly and gradually improved.     ALLERGIES: No Allergies    MEDICATIONS: Hydrochlorothiazide   Metformin Hcl  Omeprazole  Tadalafil 5 mg tablet 1 tablet PO Daily  Losartan Potassium  Rosuvastatin Calcium 20 mg tablet tablet     Notes: not taking insulin and once a week medication due to pharmacy issue   GU PSH: Laparoscopy; Lymphadenectomy - 11/13/2022 Prostate Needle Biopsy - 09/15/2022 Robotic Radical Prostatectomy - 11/13/2022       PSH Notes: hand 1980  knee 2013   NON-GU PSH: Cholecystectomy (open) Surgical Pathology, Gross And Microscopic Examination For Prostate Needle - 09/15/2022     GU PMH: Stress Incontinence - 05/19/2023, - 04/07/2023, - 03/17/2023, - 01/22/2023, - 12/26/2022, - 12/12/2022, - 12/02/2022, - 11/21/2022 Prostate Cancer - 04/07/2023, - 02/13/2023, - 11/21/2022, - 11/04/2022, - 10/10/2022, - 09/25/2022 Rising PSA after prostate cancer treatment - 02/13/2023 ED due to arterial insufficiency - 11/21/2022 Elevated PSA - 09/25/2022, - 09/15/2022, - 08/08/2022 Male ED, unspecified - 08/08/2022      PMH Notes:   1) Prostate cancer: He is s/p a BNS RAL radical prostatectomy and BPLND on 11/13/22.   Diagnosis: pT3b N1 Mx, Gleason 4+5=9 adenocarcinoma with negative surgical margins (5/21 lymph nodes positive)  Pretreatment PSA: 9.86  Pretreatment SHIM score: 5   NON-GU PMH: Muscle weakness (generalized) - 05/19/2023 Other muscle spasm - 05/19/2023 Diabetes Type 2 GERD Hypercholesterolemia Hypertension Sleep Apnea    FAMILY HISTORY: No Family History    SOCIAL HISTORY: Marital Status: Married Current Smoking Status: Patient has never smoked.   Tobacco Use Assessment Completed: Used Tobacco in last 30 days?    REVIEW OF SYSTEMS:    GU  Review Male:   Patient denies frequent urination, hard to postpone urination, burning/ pain with urination, get up at night to urinate, leakage of urine, stream starts and stops, trouble starting your streams, and have to strain to urinate .  Gastrointestinal (Lower):   Patient denies diarrhea and constipation.  Gastrointestinal  (Upper):   Patient denies nausea and vomiting.  Constitutional:   Patient denies fever, night sweats, weight loss, and fatigue.  Skin:   Patient denies itching and skin rash/ lesion.  Eyes:   Patient denies blurred vision and double vision.  Ears/ Nose/ Throat:   Patient denies sore throat and sinus problems.  Hematologic/Lymphatic:   Patient denies swollen glands and easy bruising.  Cardiovascular:   Patient denies leg swelling and chest pains.  Respiratory:   Patient denies cough and shortness of breath.  Endocrine:   Patient denies excessive thirst.  Musculoskeletal:   Patient denies back pain and joint pain.  Neurological:   Patient denies headaches and dizziness.  Psychologic:   Patient denies depression and anxiety.   VITAL SIGNS: None   MULTI-SYSTEM PHYSICAL EXAMINATION:    Constitutional: Well-nourished. No physical deformities. Normally developed. Good grooming.     Complexity of Data:  Lab Test Review:   PSA  Records Review:   Previous Patient Records  X-Ray Review: PET- PSMA Scan: Reviewed Films.     03/31/23 02/04/23 08/08/22  PSA  Total PSA 0.53 ng/mL 0.37 ng/mL 9.86 ng/mL    PROCEDURES: None   ASSESSMENT:      ICD-10 Details  1 GU:   Prostate Cancer - C61    PLAN:           Document Letter(s):  Created for Patient: Clinical Summary         Notes:   1. Biochemically recurrent prostate cancer: I had a detailed discussion with Brian Beasley today. He has just about but not quite yet regained continence which has been a slow process for him. Considering his negative PSMA PET scan at this time and his rising PSA, I did recommend that he begin systemic therapy with androgen deprivation and he will be scheduled for Eligard 45 mg. He is a candidate for definitive salvage radiation therapy as well and will meet with Dr. Kathrynn Beasley later this morning to discuss that. Dr. Kathrynn Beasley has suggested that he may extend the radiation field higher considering his lymph node involvement on his  PSMA PET scan previously and the extent of his lymph node involvement on his surgical pathology. Then he understands the importance of regaining his continence first and hopefully will be able to begin radiation within the next couple of months. He understands the potential risks and side effects of androgen deprivation therapy which were discussed in detail today.   2. Incontinence: He will continue his pelvic floor exercises and understands importance of regaining his continence prior to beginning radiation therapy. He understands that he does have some time as we begin androgen deprivation.   CC: Dr. Margaretmary Dys    E & M CODES: We spent 38 minutes dedicated to evaluation and management time, including face to face interaction, discussions on coordination of care, documentation, result review, and discussion with others as applicable.

## 2023-06-23 NOTE — Progress Notes (Signed)
                               Care Plan Summary  Name: Brian Beasley DOB: [birth year]   Your Medical Team:   Urologist -  Dr. Heloise Purpura, [facility]  Radiation Oncologist - Dr. Margaretmary Dys, [facility]    Recommendations: 1) Hormonal Therapy  2) Radiation     * These recommendations are based on information available as of today's consult.      Recommendations may change depending on the results of further tests or exams.    Next Steps: 1) You will be contacted by [facility] with an appointment to start hormonal therapy.     When appointments need to be scheduled, you will be contacted by [facility] and/or [facility].  Questions?  Please do not hesitate to call Cherlyn Cushing, BSN, RN at [phone] with any questions or concerns.  Marisue Ivan is your Oncology Nurse Navigator and is available to assist you while you're receiving your medical care at [facility].

## 2023-06-25 ENCOUNTER — Encounter: Payer: Self-pay | Admitting: Genetic Counselor

## 2023-06-25 NOTE — Progress Notes (Signed)
REFERRING PROVIDER: Margaretmary Dys, MD  PRIMARY PROVIDER:  Darrow Bussing, MD  PRIMARY REASON FOR VISIT:  1. Prostate cancer (HCC)   2. Family history of prostate cancer    HISTORY OF PRESENT ILLNESS:   Mr. Massingale, a 62 y.o. male, was seen for a [place] cancer genetics consultation at the request of Dr. Kathrynn Running due to a personal and family history of cancer.  Mr. Ninnemann presents to clinic today to discuss the possibility of a hereditary predisposition to cancer, to discuss genetic testing, and to further clarify his future cancer risks, as well as potential cancer risks for family members.   In November 2023, at the age of 89, Mr. Massaquoi was diagnosed with prostate cancer and the biopsy showed Gleason: 7. However, in January 2024, the final pathology from the prostatectomy showed Gleason: 9.   CANCER HISTORY:  Oncology History  Prostate cancer (HCC)  09/15/2022 Cancer Staging   Staging form: Prostate, AJCC 8th Edition - Clinical stage from 09/15/2022: Stage IVA (cT1c, cN1, cM0, PSA: 9.9, Grade Group: 3) - Signed by Marcello Fennel, PA-C on 06/23/2023 Histopathologic type: Adenocarcinoma, NOS Stage prefix: Initial diagnosis Prostate specific antigen (PSA) range: Less than 10 Gleason primary pattern: 4 Gleason secondary pattern: 3 Gleason score: 7 Histologic grading system: 5 grade system Number of biopsy cores examined: 12 Number of biopsy cores positive: 4 Location of positive needle core biopsies: One side   11/13/2022 Initial Diagnosis   Prostate cancer (HCC)   11/13/2022 Cancer Staging   Staging form: Prostate, AJCC 8th Edition - Pathologic stage from 11/13/2022: Stage IVA (pT3b, pN1, cM0, PSA: 9.9, Grade Group: 5) - Signed by Marcello Fennel, PA-C on 06/23/2023 Histopathologic type: Adenocarcinoma, NOS Stage prefix: Initial diagnosis Prostate specific antigen (PSA) range: Less than 10 Gleason primary pattern: 4 Gleason secondary pattern: 5 Gleason score: 9 Histologic  grading system: 5 grade system Residual tumor (R): R0 - None     Past Medical History:  Diagnosis Date   Allergic rhinitis    Allergy    Arthritis    Cancer (HCC)    skin - basil cell   Diabetes mellitus without complication (HCC)    Family history of colon cancer    Father   GERD (gastroesophageal reflux disease)    Heart murmur    In teens, no murmur heard since   Hyperlipidemia    Hypertension    OSA (obstructive sleep apnea)    PSG 10/2008 AHI 26.4/hr   Prostate cancer (HCC)    Sleep apnea     Past Surgical History:  Procedure Laterality Date   BASAL CELL CARCINOMA EXCISION     right cheek   CHOLECYSTECTOMY N/A 12/01/2013   Procedure: LAPAROSCOPIC CHOLECYSTECTOMY WITH INTRAOPERATIVE CHOLANGIOGRAM;  Surgeon: Wilmon Arms. Tsuei, MD;  Location: [facility];  Service: General;  Laterality: N/A;   HAND SURGERY     L hand, had 3 screws put in to repair bone   KNEE SURGERY Right    LYMPHADENECTOMY Bilateral 11/13/2022   Procedure: BLILATERAL PELVIC LYMPHADENECTOMY;  Surgeon: Heloise Purpura, MD;  Location: [facility];  Service: Urology;  Laterality: Bilateral;   NASAL SINUS SURGERY     opened passages to improve drainage   PROSTATE BIOPSY     ROBOT ASSISTED LAPAROSCOPIC RADICAL PROSTATECTOMY N/A 11/13/2022   Procedure: XI ROBOTIC ASSISTED LAPAROSCOPIC RADICAL PROSTATECTOMY LEVEL 2;  Surgeon: Heloise Purpura, MD;  Location: [facility];  Service: Urology;  Laterality: N/A;    Social History   Socioeconomic History  Marital status: Married    Spouse name: Veryl Ahlstrand   Number of children: 2   Years of education: Not on file   Highest education level: Not on file  Occupational History   Not on file  Tobacco Use   Smoking status: Never   Smokeless tobacco: Current    Types: Chew  Vaping Use   Vaping status: Never Used  Substance and Sexual Activity   Alcohol use: Yes    Comment: few alcoholic beverages per week   Drug use: No   Sexual activity: Yes  Other Topics Concern   Not  on file  Social History Narrative   Not on file   Social Determinants of Health   Financial Resource Strain: Not on file  Food Insecurity: No Food Insecurity (06/23/2023)   Hunger Vital Sign    Worried About Running Out of Food in the Last Year: Never true    Ran Out of Food in the Last Year: Never true  Transportation Needs: No Transportation Needs (06/23/2023)   PRAPARE - Administrator, Civil Service (Medical): No    Lack of Transportation (Non-Medical): No  Physical Activity: Not on file  Stress: Not on file  Social Connections: Not on file     FAMILY HISTORY:  We obtained a detailed, 4-generation family history.  Significant diagnoses are listed below: Family History  Problem Relation Age of Onset   Heart disease Mother    Diabetes Mother    Melanoma Mother    Prostate cancer Father 18 - 35   Esophageal cancer Neg Hx    Rectal cancer Neg Hx    Stomach cancer Neg Hx      Mr. Heinle is unaware of previous family history of genetic testing for hereditary cancer risks. There is no reported Ashkenazi Jewish ancestry.   GENETIC COUNSELING ASSESSMENT: Mr. Alteri is a 62 y.o. male with a personal and family history of cancer which is somewhat suggestive of a hereditary predisposition to cancer given his high risk prostate cancer. We, therefore, discussed and recommended the following at today's visit.   DISCUSSION: We discussed that 5 - 10% of cancer is hereditary, with most cases of prostate cancer associated with BRCA1/2.  There are other genes that can be associated with hereditary prostate cancer syndromes.  We discussed that testing is beneficial for several reasons including knowing how to follow individuals after completing their treatment, identifying whether potential treatment options would be beneficial, and understanding if other family members could be at risk for cancer and allowing them to undergo genetic testing.   We reviewed the characteristics,  features and inheritance patterns of hereditary cancer syndromes. We also discussed genetic testing, including the appropriate family members to test, the process of testing, insurance coverage and turn-around-time for results. We discussed the implications of a negative, positive, carrier and/or variant of uncertain significant result. We recommended Mr. Reisdorf pursue genetic testing for a panel that includes genes associated with prostate cancer.   Mr. Micciche  was offered a common hereditary cancer panel (34 genes+melanoma genes) and an expanded pan-cancer panel (71 genes). Mr. Zappone was informed of the benefits and limitations of each panel, including that expanded pan-cancer panels contain genes that do not have clear management guidelines at this point in time.  We also discussed that as the number of genes included on a panel increases, the chances of variants of uncertain significance increases. After considering the benefits and limitations of each gene panel, Mr. Gribbins elected  to have the common hereditary cancer panel/[employer] CustomNext.  The CustomNext gene panel offered by [employer] includes sequencing, rearrangement analysis, and RNA analysis for the following 38 genes:   APC, ATM, AXIN2, BAP1, BARD1, BMPR1A, BRCA1, BRCA2, BRIP1, CDH1, CDK4, CDKN2A, CHEK2, DICER1, HOXB13, EPCAM, GREM1, MITF, MLH1, MSH2, MSH3, MSH6, MUTYH, NF1, NTHL1, PALB2, PMS2, POLD1, POLE, POT1, PTEN, RAD51C, RAD51D, RB1, SMAD4, SMARCA4, STK11, and TP53.   Based on Mr. Bednarczyk personal and family history of cancer, he meets medical criteria for genetic testing. Despite that he meets criteria, he may still have an out of pocket cost. We discussed that if his out of pocket cost for testing is over $100, the laboratory will call and confirm whether he wants to proceed with testing.  If the out of pocket cost of testing is less than $100 he will be billed by the genetic testing laboratory.   PLAN: After considering the  risks, benefits, and limitations, Mr. Nikirk provided informed consent to pursue genetic testing and the blood sample was sent to [facility] for analysis of the Custom Panel . Results should be available within approximately 2-3 weeks' time, at which point they will be disclosed by telephone to Mr. Kenworthy, as will any additional recommendations warranted by these results. Mr. Walsh will receive a summary of his genetic counseling visit and a copy of his results once available. This information will also be available in Epic.   Mr. Takushi questions were answered to his satisfaction today. Our contact information was provided should additional questions or concerns arise. Thank you for the referral and allowing [place] to share in the care of your patient.   Lalla Brothers, MS, [facility] Genetic Counselor [email or web address] (P) [phone]  The patient was seen for a total of 20 minutes in face-to-face genetic counseling.  The patient brought his wife. Drs. Pamelia Hoit and/or Mosetta Putt were available to discuss this case as needed.   _______________________________________________________________________ For Office Staff:  Number of people involved in session: 2 Was an Intern/ student involved with case: no

## 2023-06-30 NOTE — Progress Notes (Signed)
RN spoke with triage nurse at [facility] regarding status of ADT.   Authorization is still pending for Eligard.    RN left message with patient to update.  Direct number left for call back.  Will continue to follow to ensure ADT is scheduled.

## 2023-07-03 NOTE — Progress Notes (Signed)
RN spoke with staff at [facility] to check on status of Eligard. Still pending authorization at this time.  RN notified patient, will continue to follow.

## 2023-07-06 NOTE — Progress Notes (Signed)
Patient is scheduled to start his hormonal therapy at [facility] on 9/17.  RN spoke with patient to confirm he is aware of this appointment.  Patient agreeable for me to follow up early October to assess readiness for radiation to allow time for working with physical therapy to regain bladder continence.

## 2023-07-08 ENCOUNTER — Other Ambulatory Visit (HOSPITAL_BASED_OUTPATIENT_CLINIC_OR_DEPARTMENT_OTHER): Payer: Self-pay

## 2023-07-08 MED ORDER — METFORMIN HCL 1000 MG PO TABS
1000.0000 mg | ORAL_TABLET | Freq: Two times a day (BID) | ORAL | 3 refills | Status: AC
  Filled 2023-07-08: qty 180, 90d supply, fill #0
  Filled 2023-10-03: qty 180, 90d supply, fill #1

## 2023-07-08 MED ORDER — LOSARTAN POTASSIUM 50 MG PO TABS
50.0000 mg | ORAL_TABLET | Freq: Every day | ORAL | 3 refills | Status: AC
  Filled 2023-07-08: qty 90, 90d supply, fill #0
  Filled 2023-10-03: qty 90, 90d supply, fill #1

## 2023-07-08 MED ORDER — ROSUVASTATIN CALCIUM 5 MG PO TABS
5.0000 mg | ORAL_TABLET | Freq: Every day | ORAL | 3 refills | Status: DC
  Filled 2023-07-08: qty 90, 90d supply, fill #0
  Filled 2023-10-03: qty 90, 90d supply, fill #1

## 2023-07-08 MED ORDER — HYDROCHLOROTHIAZIDE 12.5 MG PO TABS
12.5000 mg | ORAL_TABLET | Freq: Every morning | ORAL | 3 refills | Status: AC
  Filled 2023-07-08: qty 90, 90d supply, fill #0
  Filled 2023-10-03: qty 90, 90d supply, fill #1

## 2023-07-09 ENCOUNTER — Other Ambulatory Visit (HOSPITAL_BASED_OUTPATIENT_CLINIC_OR_DEPARTMENT_OTHER): Payer: Self-pay

## 2023-07-09 MED ORDER — COVID-19 MRNA VAC-TRIS(PFIZER) 30 MCG/0.3ML IM SUSY
0.3000 mL | PREFILLED_SYRINGE | Freq: Once | INTRAMUSCULAR | 0 refills | Status: AC
  Filled 2023-07-09: qty 0.3, 1d supply, fill #0

## 2023-07-10 ENCOUNTER — Encounter: Payer: Self-pay | Admitting: Genetic Counselor

## 2023-07-10 ENCOUNTER — Telehealth: Payer: Self-pay | Admitting: Genetic Counselor

## 2023-07-10 ENCOUNTER — Ambulatory Visit: Payer: Self-pay | Admitting: Genetic Counselor

## 2023-07-10 DIAGNOSIS — Z1379 Encounter for other screening for genetic and chromosomal anomalies: Secondary | ICD-10-CM

## 2023-07-10 NOTE — Progress Notes (Signed)
HPI:   Brian Beasley was previously seen in the [place] [facility] due to a personal and family history of cancer and concerns regarding a hereditary predisposition to cancer. Please refer to our prior cancer genetics clinic note for more information regarding our discussion, assessment and recommendations, at the time. Brian Beasley recent genetic test results were disclosed to him, as were recommendations warranted by these results. These results and recommendations are discussed in more detail below.  CANCER HISTORY:  Oncology History  Prostate cancer (HCC)  09/15/2022 Cancer Staging   Staging form: Prostate, AJCC 8th Edition - Clinical stage from 09/15/2022: Stage IVA (cT1c, cN1, cM0, PSA: 9.9, Grade Group: 3) - Signed by Marcello Fennel, PA-C on 06/23/2023 Histopathologic type: Adenocarcinoma, NOS Stage prefix: Initial diagnosis Prostate specific antigen (PSA) range: Less than 10 Gleason primary pattern: 4 Gleason secondary pattern: 3 Gleason score: 7 Histologic grading system: 5 grade system Number of biopsy cores examined: 12 Number of biopsy cores positive: 4 Location of positive needle core biopsies: One side   11/13/2022 Initial Diagnosis   Prostate cancer (HCC)   11/13/2022 Cancer Staging   Staging form: Prostate, AJCC 8th Edition - Pathologic stage from 11/13/2022: Stage IVA (pT3b, pN1, cM0, PSA: 9.9, Grade Group: 5) - Signed by Marcello Fennel, PA-C on 06/23/2023 Histopathologic type: Adenocarcinoma, NOS Stage prefix: Initial diagnosis Prostate specific antigen (PSA) range: Less than 10 Gleason primary pattern: 4 Gleason secondary pattern: 5 Gleason score: 9 Histologic grading system: 5 grade system Residual tumor (R): R0 - None     FAMILY HISTORY:  We obtained a detailed, 4-generation family history.  Significant diagnoses are listed below:      Family History  Problem Relation Age of Onset   Heart disease Mother     Diabetes Mother     Melanoma Mother      Prostate cancer Father 40 - 16   Esophageal cancer Neg Hx     Rectal cancer Neg Hx     Stomach cancer Neg Hx             Brian Beasley is unaware of previous family history of genetic testing for hereditary cancer risks. There is no reported Ashkenazi Jewish ancestry.     GENETIC TEST RESULTS:  The [employer] Custom[employer] Panel found no pathogenic mutations.   The CustomNext gene panel offered by [employer] includes sequencing, rearrangement analysis, and RNA analysis for the following 38 genes:   APC, ATM, AXIN2, BAP1, BARD1, BMPR1A, BRCA1, BRCA2, BRIP1, CDH1, CDK4, CDKN2A, CHEK2, DICER1, HOXB13, EPCAM, GREM1, MITF, MLH1, MSH2, MSH3, MSH6, MUTYH, NF1, NTHL1, PALB2, PMS2, POLD1, POLE, POT1, PTEN, RAD51C, RAD51D, RB1, SMAD4, SMARCA4, STK11, and TP53.    The test report has been scanned into EPIC and is located under the Molecular Pathology section of the Results Review tab.  A portion of the result report is included below for reference. Genetic testing reported out on 07/06/2023.       Even though a pathogenic variant was not identified, possible explanations for the cancer in the family may include: There may be no hereditary risk for cancer in the family. The cancers in Brian Beasley and/or his family may be due to other genetic or environmental factors. There may be a gene mutation in one of these genes that current testing methods cannot detect, but that chance is small. There could be another gene that has not yet been discovered, or that we have not yet tested, that is responsible for the cancer diagnoses  in the family.   Therefore, it is important to remain in touch with cancer genetics in the future so that we can continue to offer Brian Beasley the most up to date genetic testing.   ADDITIONAL GENETIC TESTING:  We discussed with Brian Beasley that his genetic testing was fairly extensive.  If there are genes identified to increase cancer risk that can be analyzed in the future, we would  be happy to discuss and coordinate this testing at that time.    CANCER SCREENING RECOMMENDATIONS:  Brian Beasley test result is considered negative (normal).  This means that we have not identified a hereditary cause for his personal and family history of cancer at this time.   An individual's cancer risk and medical management are not determined by genetic test results alone. Overall cancer risk assessment incorporates additional factors, including personal medical history, family history, and any available genetic information that may result in a personalized plan for cancer prevention and surveillance. Therefore, it is recommended he continue to follow the cancer management and screening guidelines provided by his oncology and primary healthcare provider.  RECOMMENDATIONS FOR FAMILY MEMBERS:   Since he did not inherit a mutation in a cancer predisposition gene included on this panel, his children could not have inherited a mutation from him in one of these genes.  FOLLOW-UP:  Cancer genetics is a rapidly advancing field and it is possible that new genetic tests will be appropriate for him and/or his family members in the future. We encouraged him to remain in contact with cancer genetics on an annual basis so we can update his personal and family histories and let him know of advances in cancer genetics that may benefit this family.   Our contact number was provided. Brian Beasley questions were answered to his satisfaction, and he knows he is welcome to call us at anytime with additional questions or concerns.   Brian Brothers, MS, [facility] Genetic Counselor [email or web address] (P) [phone]

## 2023-07-10 NOTE — Telephone Encounter (Signed)
I contacted Mr. Koven to discuss his genetic testing results. No pathogenic variants were identified in the 38 genes analyzed. Detailed clinic note to follow.  The test report has been scanned into EPIC and is located under the Molecular Pathology section of the Results Review tab.  A portion of the result report is included below for reference.   Lalla Brothers, MS, [facility] Genetic Counselor [email or web address] (P) [phone]

## 2023-07-16 ENCOUNTER — Encounter (HOSPITAL_BASED_OUTPATIENT_CLINIC_OR_DEPARTMENT_OTHER): Payer: Self-pay

## 2023-07-16 ENCOUNTER — Other Ambulatory Visit (HOSPITAL_BASED_OUTPATIENT_CLINIC_OR_DEPARTMENT_OTHER): Payer: Self-pay

## 2023-07-16 MED ORDER — TRULICITY 0.75 MG/0.5ML ~~LOC~~ SOAJ
0.7500 mg | SUBCUTANEOUS | 0 refills | Status: DC
  Filled 2023-07-16 – 2023-07-20 (×2): qty 2, 28d supply, fill #0

## 2023-07-20 ENCOUNTER — Other Ambulatory Visit (HOSPITAL_BASED_OUTPATIENT_CLINIC_OR_DEPARTMENT_OTHER): Payer: Self-pay

## 2023-07-30 NOTE — Progress Notes (Signed)
RN left message for call back to assess readiness to start daily radiation treatment.

## 2023-08-31 NOTE — Progress Notes (Signed)
RN left message for call back to re-assess readiness for radiation.

## 2023-09-01 ENCOUNTER — Other Ambulatory Visit (HOSPITAL_BASED_OUTPATIENT_CLINIC_OR_DEPARTMENT_OTHER): Payer: Self-pay

## 2023-09-01 MED ORDER — FLUOROURACIL 5 % EX CREA
TOPICAL_CREAM | CUTANEOUS | 1 refills | Status: DC
  Filled 2023-09-01: qty 40, 30d supply, fill #0

## 2023-09-02 ENCOUNTER — Other Ambulatory Visit (HOSPITAL_BASED_OUTPATIENT_CLINIC_OR_DEPARTMENT_OTHER): Payer: Self-pay

## 2023-09-02 MED ORDER — TRULICITY 0.75 MG/0.5ML ~~LOC~~ SOAJ
0.7500 mg | SUBCUTANEOUS | 3 refills | Status: DC
  Filled 2023-09-02: qty 2, 28d supply, fill #0
  Filled 2023-10-03: qty 2, 28d supply, fill #1
  Filled 2023-10-29: qty 2, 28d supply, fill #2

## 2023-09-04 NOTE — Progress Notes (Addendum)
RN spoke with patient and patient is still wearing a pad due to urinary incontinence.  Patient spoke with Dr. Laverle Patter and recommendations to continue pelvic floor exercises and re-evaluate.    RN will follow up prior to holiday to check with improvement.

## 2023-09-21 ENCOUNTER — Telehealth: Payer: Self-pay | Admitting: *Deleted

## 2023-09-21 NOTE — Progress Notes (Signed)
RN spoke with patient to review readiness for radiation.  Patient continues with incontinence but feels like he is ready to proceed with salvage radiation.  RN will notified MD's. Education provided on next steps for CT Simulation.   Plan of care in progress.

## 2023-09-21 NOTE — Telephone Encounter (Signed)
Called patient to ask about coming for sim - patient agreed to come on  10-02-23- arrival time- 8:45 am @ [facility]

## 2023-09-22 ENCOUNTER — Telehealth: Payer: Self-pay | Admitting: *Deleted

## 2023-09-22 NOTE — Telephone Encounter (Signed)
CALLED PATIENT TO INFORM THAT SIM APPT. HAS BEEN MOVED TO 10-02-23- ARRIVAL TIME- 10:45 AM @ [facility], SPOKE WITH PATIENT AND HE IS AWARE OF THIS CHANGE.

## 2023-10-01 ENCOUNTER — Telehealth: Payer: Self-pay | Admitting: *Deleted

## 2023-10-01 NOTE — Progress Notes (Signed)
  Radiation Oncology         [phone] ________________________________  Name: Brian Beasley MRN: [id number]  Date: 10/02/2023  DOB: [birth year]  SIMULATION AND TREATMENT PLANNING NOTE    ICD-10-CM   1. Prostate cancer [facility])  C61       DIAGNOSIS:   62 y.o. gentleman with a rising, detectable PSA of 0.93 s/p RALP 10/2022 for Stage pT3bN1, Gleason 4+5 prostate cancer   NARRATIVE:  The patient was brought to the CT Simulation planning suite.  Identity was confirmed.  All relevant records and images related to the planned course of therapy were reviewed.  The patient freely provided informed written consent to proceed with treatment after reviewing the details related to the planned course of therapy. The consent form was witnessed and verified by the simulation staff.  Then, the patient was set-up in a stable reproducible supine position for radiation therapy.  A vacuum lock pillow device was custom fabricated to position his legs in a reproducible immobilized position.  Then, I performed a urethrogram under sterile conditions to identify the prostatic apex.  CT images were obtained.  Surface markings were placed.  The CT images were loaded into the planning software.  Then the prostate target and avoidance structures including the rectum, bladder, bowel and hips were contoured.  Treatment planning then occurred.  The radiation prescription was entered and confirmed.  A total of one complex treatment devices was fabricated. I have requested : Intensity Modulated Radiotherapy (IMRT) is medically necessary for this case for the following reason:  Rectal sparing.Marland Kitchen  PLAN:   The prostate fossa and pelvic lymph nodes will initially be treated to 45 Gy in 25 fractions of 1.8 Gy followed by a boost to the fossa only, to 68.4 Gy with 13 additional fractions of 1.8 Gy   ________________________________  Artist Pais Kathrynn Running, M.D.

## 2023-10-01 NOTE — Telephone Encounter (Signed)
Called patient to remind of sim appt. for 10-02-23- arrival time- 10:45 am @ [facility], spoke with patient and he is aware of this procedure

## 2023-10-02 ENCOUNTER — Ambulatory Visit
Admission: RE | Admit: 2023-10-02 | Discharge: 2023-10-02 | Disposition: A | Discharge status: Home / Self Care | Payer: BC Managed Care – PPO | Source: Ambulatory Visit | Attending: Radiation Oncology | Admitting: Radiation Oncology

## 2023-10-02 DIAGNOSIS — Z510 Encounter for antineoplastic radiation therapy: Secondary | ICD-10-CM | POA: Insufficient documentation

## 2023-10-02 DIAGNOSIS — C61 Malignant neoplasm of prostate: Secondary | ICD-10-CM | POA: Diagnosis present

## 2023-10-05 ENCOUNTER — Other Ambulatory Visit (HOSPITAL_BASED_OUTPATIENT_CLINIC_OR_DEPARTMENT_OTHER): Payer: Self-pay

## 2023-10-12 DIAGNOSIS — Z510 Encounter for antineoplastic radiation therapy: Secondary | ICD-10-CM | POA: Diagnosis not present

## 2023-10-13 ENCOUNTER — Ambulatory Visit
Admission: RE | Admit: 2023-10-13 | Discharge: 2023-10-13 | Disposition: A | Discharge status: Home / Self Care | Payer: BC Managed Care – PPO | Source: Ambulatory Visit | Attending: Radiation Oncology

## 2023-10-13 ENCOUNTER — Other Ambulatory Visit: Payer: Self-pay

## 2023-10-13 DIAGNOSIS — Z510 Encounter for antineoplastic radiation therapy: Secondary | ICD-10-CM | POA: Diagnosis not present

## 2023-10-13 LAB — RAD ONC ARIA SESSION SUMMARY
Course Elapsed Days: 0
Plan Fractions Treated to Date: 1
Plan Prescribed Dose Per Fraction: 1.8 Gy
Plan Total Fractions Prescribed: 25
Plan Total Prescribed Dose: 45 Gy
Reference Point Dosage Given to Date: 1.8 Gy
Reference Point Session Dosage Given: 1.8 Gy
Session Number: 1

## 2023-10-14 ENCOUNTER — Ambulatory Visit
Admission: RE | Admit: 2023-10-14 | Discharge: 2023-10-14 | Disposition: A | Discharge status: Home / Self Care | Payer: BC Managed Care – PPO | Source: Ambulatory Visit | Attending: Radiation Oncology

## 2023-10-14 ENCOUNTER — Other Ambulatory Visit: Payer: Self-pay

## 2023-10-14 DIAGNOSIS — Z510 Encounter for antineoplastic radiation therapy: Secondary | ICD-10-CM | POA: Diagnosis not present

## 2023-10-14 LAB — RAD ONC ARIA SESSION SUMMARY
Course Elapsed Days: 1
Plan Fractions Treated to Date: 2
Plan Prescribed Dose Per Fraction: 1.8 Gy
Plan Total Fractions Prescribed: 25
Plan Total Prescribed Dose: 45 Gy
Reference Point Dosage Given to Date: 3.6 Gy
Reference Point Session Dosage Given: 1.8 Gy
Session Number: 2

## 2023-10-15 ENCOUNTER — Other Ambulatory Visit: Payer: Self-pay

## 2023-10-15 ENCOUNTER — Ambulatory Visit
Admission: RE | Admit: 2023-10-15 | Discharge: 2023-10-15 | Disposition: A | Discharge status: Home / Self Care | Payer: BC Managed Care – PPO | Source: Ambulatory Visit | Attending: Radiation Oncology | Admitting: Radiation Oncology

## 2023-10-15 DIAGNOSIS — Z510 Encounter for antineoplastic radiation therapy: Secondary | ICD-10-CM | POA: Diagnosis not present

## 2023-10-15 LAB — RAD ONC ARIA SESSION SUMMARY
Course Elapsed Days: 2
Plan Fractions Treated to Date: 3
Plan Prescribed Dose Per Fraction: 1.8 Gy
Plan Total Fractions Prescribed: 25
Plan Total Prescribed Dose: 45 Gy
Reference Point Dosage Given to Date: 5.4 Gy
Reference Point Session Dosage Given: 1.8 Gy
Session Number: 3

## 2023-10-16 ENCOUNTER — Ambulatory Visit
Admission: RE | Admit: 2023-10-16 | Discharge: 2023-10-16 | Disposition: A | Discharge status: Home / Self Care | Payer: BC Managed Care – PPO | Source: Ambulatory Visit | Attending: Radiation Oncology | Admitting: Radiation Oncology

## 2023-10-16 ENCOUNTER — Other Ambulatory Visit: Payer: Self-pay

## 2023-10-16 DIAGNOSIS — Z510 Encounter for antineoplastic radiation therapy: Secondary | ICD-10-CM | POA: Diagnosis not present

## 2023-10-16 LAB — RAD ONC ARIA SESSION SUMMARY
Course Elapsed Days: 3
Plan Fractions Treated to Date: 4
Plan Prescribed Dose Per Fraction: 1.8 Gy
Plan Total Fractions Prescribed: 25
Plan Total Prescribed Dose: 45 Gy
Reference Point Dosage Given to Date: 7.2 Gy
Reference Point Session Dosage Given: 1.8 Gy
Session Number: 4

## 2023-10-19 ENCOUNTER — Other Ambulatory Visit: Payer: Self-pay

## 2023-10-19 ENCOUNTER — Ambulatory Visit
Admission: RE | Admit: 2023-10-19 | Discharge: 2023-10-19 | Disposition: A | Discharge status: Home / Self Care | Payer: BC Managed Care – PPO | Source: Ambulatory Visit | Attending: Radiation Oncology | Admitting: Radiation Oncology

## 2023-10-19 DIAGNOSIS — Z510 Encounter for antineoplastic radiation therapy: Secondary | ICD-10-CM | POA: Diagnosis not present

## 2023-10-19 LAB — RAD ONC ARIA SESSION SUMMARY
Course Elapsed Days: 6
Plan Fractions Treated to Date: 5
Plan Prescribed Dose Per Fraction: 1.8 Gy
Plan Total Fractions Prescribed: 25
Plan Total Prescribed Dose: 45 Gy
Reference Point Dosage Given to Date: 9 Gy
Reference Point Session Dosage Given: 1.8 Gy
Session Number: 5

## 2023-10-20 ENCOUNTER — Other Ambulatory Visit: Payer: Self-pay

## 2023-10-20 ENCOUNTER — Ambulatory Visit
Admission: RE | Admit: 2023-10-20 | Discharge: 2023-10-20 | Disposition: A | Discharge status: Home / Self Care | Payer: BC Managed Care – PPO | Source: Ambulatory Visit | Attending: Radiation Oncology | Admitting: Radiation Oncology

## 2023-10-20 DIAGNOSIS — Z510 Encounter for antineoplastic radiation therapy: Secondary | ICD-10-CM | POA: Diagnosis not present

## 2023-10-20 LAB — RAD ONC ARIA SESSION SUMMARY
Course Elapsed Days: 7
Plan Fractions Treated to Date: 6
Plan Prescribed Dose Per Fraction: 1.8 Gy
Plan Total Fractions Prescribed: 25
Plan Total Prescribed Dose: 45 Gy
Reference Point Dosage Given to Date: 10.8 Gy
Reference Point Session Dosage Given: 1.8 Gy
Session Number: 6

## 2023-10-22 ENCOUNTER — Ambulatory Visit
Admission: RE | Admit: 2023-10-22 | Discharge: 2023-10-22 | Disposition: A | Discharge status: Home / Self Care | Payer: BC Managed Care – PPO | Source: Ambulatory Visit | Attending: Radiation Oncology | Admitting: Radiation Oncology

## 2023-10-22 ENCOUNTER — Other Ambulatory Visit: Payer: Self-pay

## 2023-10-22 DIAGNOSIS — Z510 Encounter for antineoplastic radiation therapy: Secondary | ICD-10-CM | POA: Diagnosis not present

## 2023-10-22 LAB — RAD ONC ARIA SESSION SUMMARY
Course Elapsed Days: 9
Plan Fractions Treated to Date: 7
Plan Prescribed Dose Per Fraction: 1.8 Gy
Plan Total Fractions Prescribed: 25
Plan Total Prescribed Dose: 45 Gy
Reference Point Dosage Given to Date: 12.6 Gy
Reference Point Session Dosage Given: 1.8 Gy
Session Number: 7

## 2023-10-23 ENCOUNTER — Ambulatory Visit
Admission: RE | Admit: 2023-10-23 | Discharge: 2023-10-23 | Disposition: A | Discharge status: Home / Self Care | Payer: BC Managed Care – PPO | Source: Ambulatory Visit | Attending: Radiation Oncology

## 2023-10-23 ENCOUNTER — Ambulatory Visit
Admission: RE | Admit: 2023-10-23 | Discharge: 2023-10-23 | Disposition: A | Discharge status: Home / Self Care | Payer: BC Managed Care – PPO | Source: Ambulatory Visit | Attending: Radiation Oncology | Admitting: Radiation Oncology

## 2023-10-23 ENCOUNTER — Other Ambulatory Visit: Payer: Self-pay

## 2023-10-23 DIAGNOSIS — Z510 Encounter for antineoplastic radiation therapy: Secondary | ICD-10-CM | POA: Diagnosis not present

## 2023-10-23 LAB — RAD ONC ARIA SESSION SUMMARY
Course Elapsed Days: 10
Plan Fractions Treated to Date: 8
Plan Prescribed Dose Per Fraction: 1.8 Gy
Plan Total Fractions Prescribed: 25
Plan Total Prescribed Dose: 45 Gy
Reference Point Dosage Given to Date: 14.4 Gy
Reference Point Session Dosage Given: 1.8 Gy
Session Number: 8

## 2023-10-26 ENCOUNTER — Other Ambulatory Visit: Payer: Self-pay

## 2023-10-26 ENCOUNTER — Ambulatory Visit
Admission: RE | Admit: 2023-10-26 | Discharge: 2023-10-26 | Disposition: A | Discharge status: Home / Self Care | Payer: BC Managed Care – PPO | Source: Ambulatory Visit | Attending: Radiation Oncology | Admitting: Radiation Oncology

## 2023-10-26 DIAGNOSIS — Z510 Encounter for antineoplastic radiation therapy: Secondary | ICD-10-CM | POA: Diagnosis not present

## 2023-10-26 LAB — RAD ONC ARIA SESSION SUMMARY
Course Elapsed Days: 13
Plan Fractions Treated to Date: 9
Plan Prescribed Dose Per Fraction: 1.8 Gy
Plan Total Fractions Prescribed: 25
Plan Total Prescribed Dose: 45 Gy
Reference Point Dosage Given to Date: 16.2 Gy
Reference Point Session Dosage Given: 1.8 Gy
Session Number: 9

## 2023-10-27 ENCOUNTER — Other Ambulatory Visit: Payer: Self-pay

## 2023-10-27 ENCOUNTER — Ambulatory Visit
Admission: RE | Admit: 2023-10-27 | Discharge: 2023-10-27 | Disposition: A | Discharge status: Home / Self Care | Payer: BC Managed Care – PPO | Source: Ambulatory Visit | Attending: Radiation Oncology

## 2023-10-27 DIAGNOSIS — Z510 Encounter for antineoplastic radiation therapy: Secondary | ICD-10-CM | POA: Diagnosis not present

## 2023-10-27 LAB — RAD ONC ARIA SESSION SUMMARY
Course Elapsed Days: 14
Plan Fractions Treated to Date: 10
Plan Prescribed Dose Per Fraction: 1.8 Gy
Plan Total Fractions Prescribed: 25
Plan Total Prescribed Dose: 45 Gy
Reference Point Dosage Given to Date: 18 Gy
Reference Point Session Dosage Given: 1.8 Gy
Session Number: 10

## 2023-10-29 ENCOUNTER — Ambulatory Visit
Admission: RE | Admit: 2023-10-29 | Discharge: 2023-10-29 | Disposition: A | Discharge status: Home / Self Care | Payer: Medicare PPO | Source: Ambulatory Visit | Attending: Radiation Oncology | Admitting: Radiation Oncology

## 2023-10-29 ENCOUNTER — Other Ambulatory Visit: Payer: Self-pay

## 2023-10-29 DIAGNOSIS — Z510 Encounter for antineoplastic radiation therapy: Secondary | ICD-10-CM | POA: Diagnosis present

## 2023-10-29 DIAGNOSIS — C61 Malignant neoplasm of prostate: Secondary | ICD-10-CM | POA: Diagnosis present

## 2023-10-29 LAB — RAD ONC ARIA SESSION SUMMARY
Course Elapsed Days: 16
Plan Fractions Treated to Date: 11
Plan Prescribed Dose Per Fraction: 1.8 Gy
Plan Total Fractions Prescribed: 25
Plan Total Prescribed Dose: 45 Gy
Reference Point Dosage Given to Date: 19.8 Gy
Reference Point Session Dosage Given: 1.8 Gy
Session Number: 11

## 2023-10-30 ENCOUNTER — Ambulatory Visit: Payer: Medicare PPO

## 2023-10-30 ENCOUNTER — Other Ambulatory Visit (HOSPITAL_BASED_OUTPATIENT_CLINIC_OR_DEPARTMENT_OTHER): Payer: Self-pay

## 2023-10-30 ENCOUNTER — Ambulatory Visit
Admission: RE | Admit: 2023-10-30 | Discharge: 2023-10-30 | Disposition: A | Discharge status: Home / Self Care | Payer: Medicare PPO | Source: Ambulatory Visit | Attending: Radiation Oncology | Admitting: Radiation Oncology

## 2023-10-30 ENCOUNTER — Other Ambulatory Visit: Payer: Self-pay

## 2023-10-30 DIAGNOSIS — Z510 Encounter for antineoplastic radiation therapy: Secondary | ICD-10-CM | POA: Diagnosis not present

## 2023-10-30 LAB — RAD ONC ARIA SESSION SUMMARY
Course Elapsed Days: 17
Plan Fractions Treated to Date: 12
Plan Prescribed Dose Per Fraction: 1.8 Gy
Plan Total Fractions Prescribed: 25
Plan Total Prescribed Dose: 45 Gy
Reference Point Dosage Given to Date: 21.6 Gy
Reference Point Session Dosage Given: 1.8 Gy
Session Number: 12

## 2023-11-02 ENCOUNTER — Other Ambulatory Visit (HOSPITAL_BASED_OUTPATIENT_CLINIC_OR_DEPARTMENT_OTHER): Payer: Self-pay

## 2023-11-02 ENCOUNTER — Ambulatory Visit
Admission: RE | Admit: 2023-11-02 | Discharge: 2023-11-02 | Disposition: A | Discharge status: Home / Self Care | Payer: Medicare PPO | Source: Ambulatory Visit | Attending: Radiation Oncology | Admitting: Radiation Oncology

## 2023-11-02 ENCOUNTER — Other Ambulatory Visit: Payer: Self-pay

## 2023-11-02 DIAGNOSIS — Z510 Encounter for antineoplastic radiation therapy: Secondary | ICD-10-CM | POA: Diagnosis not present

## 2023-11-02 LAB — RAD ONC ARIA SESSION SUMMARY
Course Elapsed Days: 20
Plan Fractions Treated to Date: 13
Plan Prescribed Dose Per Fraction: 1.8 Gy
Plan Total Fractions Prescribed: 25
Plan Total Prescribed Dose: 45 Gy
Reference Point Dosage Given to Date: 23.4 Gy
Reference Point Session Dosage Given: 1.8 Gy
Session Number: 13

## 2023-11-03 ENCOUNTER — Other Ambulatory Visit: Payer: Self-pay

## 2023-11-03 ENCOUNTER — Ambulatory Visit
Admission: RE | Admit: 2023-11-03 | Discharge: 2023-11-03 | Disposition: A | Discharge status: Home / Self Care | Payer: Medicare PPO | Source: Ambulatory Visit | Attending: Radiation Oncology | Admitting: Radiation Oncology

## 2023-11-03 DIAGNOSIS — Z510 Encounter for antineoplastic radiation therapy: Secondary | ICD-10-CM | POA: Diagnosis not present

## 2023-11-03 LAB — RAD ONC ARIA SESSION SUMMARY
Course Elapsed Days: 21
Plan Fractions Treated to Date: 14
Plan Prescribed Dose Per Fraction: 1.8 Gy
Plan Total Fractions Prescribed: 25
Plan Total Prescribed Dose: 45 Gy
Reference Point Dosage Given to Date: 25.2 Gy
Reference Point Session Dosage Given: 1.8 Gy
Session Number: 14

## 2023-11-04 ENCOUNTER — Ambulatory Visit
Admission: RE | Admit: 2023-11-04 | Discharge: 2023-11-04 | Disposition: A | Discharge status: Home / Self Care | Payer: Medicare PPO | Source: Ambulatory Visit | Attending: Radiation Oncology | Admitting: Radiation Oncology

## 2023-11-04 ENCOUNTER — Other Ambulatory Visit: Payer: Self-pay

## 2023-11-04 DIAGNOSIS — Z510 Encounter for antineoplastic radiation therapy: Secondary | ICD-10-CM | POA: Diagnosis not present

## 2023-11-04 LAB — RAD ONC ARIA SESSION SUMMARY
Course Elapsed Days: 22
Plan Fractions Treated to Date: 15
Plan Prescribed Dose Per Fraction: 1.8 Gy
Plan Total Fractions Prescribed: 25
Plan Total Prescribed Dose: 45 Gy
Reference Point Dosage Given to Date: 27 Gy
Reference Point Session Dosage Given: 1.8 Gy
Session Number: 15

## 2023-11-05 ENCOUNTER — Ambulatory Visit
Admission: RE | Admit: 2023-11-05 | Discharge: 2023-11-05 | Disposition: A | Discharge status: Home / Self Care | Payer: Medicare PPO | Source: Ambulatory Visit | Attending: Radiation Oncology | Admitting: Radiation Oncology

## 2023-11-05 ENCOUNTER — Other Ambulatory Visit: Payer: Self-pay

## 2023-11-05 ENCOUNTER — Ambulatory Visit: Payer: Medicare PPO

## 2023-11-05 DIAGNOSIS — Z510 Encounter for antineoplastic radiation therapy: Secondary | ICD-10-CM | POA: Diagnosis not present

## 2023-11-05 LAB — RAD ONC ARIA SESSION SUMMARY
Course Elapsed Days: 23
Plan Fractions Treated to Date: 16
Plan Prescribed Dose Per Fraction: 1.8 Gy
Plan Total Fractions Prescribed: 25
Plan Total Prescribed Dose: 45 Gy
Reference Point Dosage Given to Date: 28.8 Gy
Reference Point Session Dosage Given: 1.8 Gy
Session Number: 16

## 2023-11-06 ENCOUNTER — Other Ambulatory Visit: Payer: Self-pay

## 2023-11-06 ENCOUNTER — Ambulatory Visit
Admission: RE | Admit: 2023-11-06 | Discharge: 2023-11-06 | Disposition: A | Discharge status: Home / Self Care | Payer: Medicare PPO | Source: Ambulatory Visit | Attending: Radiation Oncology | Admitting: Radiation Oncology

## 2023-11-06 DIAGNOSIS — Z510 Encounter for antineoplastic radiation therapy: Secondary | ICD-10-CM | POA: Diagnosis not present

## 2023-11-06 LAB — RAD ONC ARIA SESSION SUMMARY
Course Elapsed Days: 24
Plan Fractions Treated to Date: 17
Plan Prescribed Dose Per Fraction: 1.8 Gy
Plan Total Fractions Prescribed: 25
Plan Total Prescribed Dose: 45 Gy
Reference Point Dosage Given to Date: 30.6 Gy
Reference Point Session Dosage Given: 1.8 Gy
Session Number: 17

## 2023-11-09 ENCOUNTER — Other Ambulatory Visit: Payer: Self-pay

## 2023-11-09 ENCOUNTER — Ambulatory Visit
Admission: RE | Admit: 2023-11-09 | Discharge: 2023-11-09 | Disposition: A | Discharge status: Home / Self Care | Payer: Medicare PPO | Source: Ambulatory Visit | Attending: Radiation Oncology | Admitting: Radiation Oncology

## 2023-11-09 DIAGNOSIS — Z510 Encounter for antineoplastic radiation therapy: Secondary | ICD-10-CM | POA: Diagnosis not present

## 2023-11-09 LAB — RAD ONC ARIA SESSION SUMMARY
Course Elapsed Days: 27
Plan Fractions Treated to Date: 18
Plan Prescribed Dose Per Fraction: 1.8 Gy
Plan Total Fractions Prescribed: 25
Plan Total Prescribed Dose: 45 Gy
Reference Point Dosage Given to Date: 32.4 Gy
Reference Point Session Dosage Given: 1.8 Gy
Session Number: 18

## 2023-11-10 ENCOUNTER — Other Ambulatory Visit: Payer: Self-pay

## 2023-11-10 ENCOUNTER — Ambulatory Visit
Admission: RE | Admit: 2023-11-10 | Discharge: 2023-11-10 | Disposition: A | Discharge status: Home / Self Care | Payer: Medicare PPO | Source: Ambulatory Visit | Attending: Radiation Oncology | Admitting: Radiation Oncology

## 2023-11-10 DIAGNOSIS — Z510 Encounter for antineoplastic radiation therapy: Secondary | ICD-10-CM | POA: Diagnosis not present

## 2023-11-10 LAB — RAD ONC ARIA SESSION SUMMARY
Course Elapsed Days: 28
Plan Fractions Treated to Date: 19
Plan Prescribed Dose Per Fraction: 1.8 Gy
Plan Total Fractions Prescribed: 25
Plan Total Prescribed Dose: 45 Gy
Reference Point Dosage Given to Date: 34.2 Gy
Reference Point Session Dosage Given: 1.8 Gy
Session Number: 19

## 2023-11-11 ENCOUNTER — Other Ambulatory Visit: Payer: Self-pay

## 2023-11-11 ENCOUNTER — Ambulatory Visit
Admission: RE | Admit: 2023-11-11 | Discharge: 2023-11-11 | Disposition: A | Discharge status: Home / Self Care | Payer: Medicare PPO | Source: Ambulatory Visit | Attending: Radiation Oncology | Admitting: Radiation Oncology

## 2023-11-11 DIAGNOSIS — Z510 Encounter for antineoplastic radiation therapy: Secondary | ICD-10-CM | POA: Diagnosis not present

## 2023-11-11 LAB — RAD ONC ARIA SESSION SUMMARY
Course Elapsed Days: 29
Plan Fractions Treated to Date: 20
Plan Prescribed Dose Per Fraction: 1.8 Gy
Plan Total Fractions Prescribed: 25
Plan Total Prescribed Dose: 45 Gy
Reference Point Dosage Given to Date: 36 Gy
Reference Point Session Dosage Given: 1.8 Gy
Session Number: 20

## 2023-11-12 ENCOUNTER — Other Ambulatory Visit: Payer: Self-pay

## 2023-11-12 ENCOUNTER — Ambulatory Visit
Admission: RE | Admit: 2023-11-12 | Discharge: 2023-11-12 | Disposition: A | Discharge status: Home / Self Care | Payer: Medicare PPO | Source: Ambulatory Visit | Attending: Radiation Oncology | Admitting: Radiation Oncology

## 2023-11-12 DIAGNOSIS — Z510 Encounter for antineoplastic radiation therapy: Secondary | ICD-10-CM | POA: Diagnosis not present

## 2023-11-12 LAB — RAD ONC ARIA SESSION SUMMARY
Course Elapsed Days: 30
Plan Fractions Treated to Date: 21
Plan Prescribed Dose Per Fraction: 1.8 Gy
Plan Total Fractions Prescribed: 25
Plan Total Prescribed Dose: 45 Gy
Reference Point Dosage Given to Date: 37.8 Gy
Reference Point Session Dosage Given: 1.8 Gy
Session Number: 21

## 2023-11-13 ENCOUNTER — Other Ambulatory Visit: Payer: Self-pay

## 2023-11-13 ENCOUNTER — Ambulatory Visit
Admission: RE | Admit: 2023-11-13 | Discharge: 2023-11-13 | Disposition: A | Discharge status: Home / Self Care | Payer: Medicare PPO | Source: Ambulatory Visit | Attending: Radiation Oncology | Admitting: Radiation Oncology

## 2023-11-13 DIAGNOSIS — Z510 Encounter for antineoplastic radiation therapy: Secondary | ICD-10-CM | POA: Diagnosis not present

## 2023-11-13 LAB — RAD ONC ARIA SESSION SUMMARY
Course Elapsed Days: 31
Plan Fractions Treated to Date: 22
Plan Prescribed Dose Per Fraction: 1.8 Gy
Plan Total Fractions Prescribed: 25
Plan Total Prescribed Dose: 45 Gy
Reference Point Dosage Given to Date: 39.6 Gy
Reference Point Session Dosage Given: 1.8 Gy
Session Number: 22

## 2023-11-16 ENCOUNTER — Ambulatory Visit
Admission: RE | Admit: 2023-11-16 | Discharge: 2023-11-16 | Disposition: A | Discharge status: Home / Self Care | Payer: Medicare PPO | Source: Ambulatory Visit | Attending: Radiation Oncology | Admitting: Radiation Oncology

## 2023-11-16 ENCOUNTER — Other Ambulatory Visit: Payer: Self-pay

## 2023-11-16 DIAGNOSIS — Z510 Encounter for antineoplastic radiation therapy: Secondary | ICD-10-CM | POA: Diagnosis not present

## 2023-11-16 LAB — RAD ONC ARIA SESSION SUMMARY
Course Elapsed Days: 34
Plan Fractions Treated to Date: 23
Plan Prescribed Dose Per Fraction: 1.8 Gy
Plan Total Fractions Prescribed: 25
Plan Total Prescribed Dose: 45 Gy
Reference Point Dosage Given to Date: 41.4 Gy
Reference Point Session Dosage Given: 1.8 Gy
Session Number: 23

## 2023-11-17 ENCOUNTER — Other Ambulatory Visit: Payer: Self-pay

## 2023-11-17 ENCOUNTER — Ambulatory Visit
Admission: RE | Admit: 2023-11-17 | Discharge: 2023-11-17 | Disposition: A | Discharge status: Home / Self Care | Payer: Medicare PPO | Source: Ambulatory Visit | Attending: Radiation Oncology | Admitting: Radiation Oncology

## 2023-11-17 DIAGNOSIS — Z510 Encounter for antineoplastic radiation therapy: Secondary | ICD-10-CM | POA: Diagnosis not present

## 2023-11-17 LAB — RAD ONC ARIA SESSION SUMMARY
Course Elapsed Days: 35
Plan Fractions Treated to Date: 24
Plan Prescribed Dose Per Fraction: 1.8 Gy
Plan Total Fractions Prescribed: 25
Plan Total Prescribed Dose: 45 Gy
Reference Point Dosage Given to Date: 43.2 Gy
Reference Point Session Dosage Given: 1.8 Gy
Session Number: 24

## 2023-11-18 ENCOUNTER — Ambulatory Visit
Admission: RE | Admit: 2023-11-18 | Discharge: 2023-11-18 | Disposition: A | Discharge status: Home / Self Care | Payer: Medicare PPO | Source: Ambulatory Visit | Attending: Radiation Oncology | Admitting: Radiation Oncology

## 2023-11-18 ENCOUNTER — Other Ambulatory Visit: Payer: Self-pay

## 2023-11-18 DIAGNOSIS — Z510 Encounter for antineoplastic radiation therapy: Secondary | ICD-10-CM | POA: Diagnosis not present

## 2023-11-18 LAB — RAD ONC ARIA SESSION SUMMARY
Course Elapsed Days: 36
Plan Fractions Treated to Date: 25
Plan Prescribed Dose Per Fraction: 1.8 Gy
Plan Total Fractions Prescribed: 25
Plan Total Prescribed Dose: 45 Gy
Reference Point Dosage Given to Date: 45 Gy
Reference Point Session Dosage Given: 1.8 Gy
Session Number: 25

## 2023-11-19 ENCOUNTER — Ambulatory Visit: Payer: Medicare PPO

## 2023-11-20 ENCOUNTER — Ambulatory Visit
Admission: RE | Admit: 2023-11-20 | Discharge: 2023-11-20 | Disposition: A | Discharge status: Home / Self Care | Payer: Medicare PPO | Source: Ambulatory Visit | Attending: Radiation Oncology | Admitting: Radiation Oncology

## 2023-11-20 ENCOUNTER — Other Ambulatory Visit: Payer: Self-pay

## 2023-11-20 DIAGNOSIS — Z510 Encounter for antineoplastic radiation therapy: Secondary | ICD-10-CM | POA: Diagnosis not present

## 2023-11-20 LAB — RAD ONC ARIA SESSION SUMMARY
Course Elapsed Days: 38
Plan Fractions Treated to Date: 1
Plan Prescribed Dose Per Fraction: 1.8 Gy
Plan Total Fractions Prescribed: 13
Plan Total Prescribed Dose: 23.4 Gy
Reference Point Dosage Given to Date: 1.8 Gy
Reference Point Session Dosage Given: 1.8 Gy
Session Number: 26

## 2023-11-23 ENCOUNTER — Ambulatory Visit
Admission: RE | Admit: 2023-11-23 | Discharge: 2023-11-23 | Disposition: A | Discharge status: Home / Self Care | Payer: Medicare PPO | Source: Ambulatory Visit | Attending: Radiation Oncology | Admitting: Radiation Oncology

## 2023-11-23 ENCOUNTER — Other Ambulatory Visit: Payer: Self-pay

## 2023-11-23 DIAGNOSIS — Z510 Encounter for antineoplastic radiation therapy: Secondary | ICD-10-CM | POA: Diagnosis not present

## 2023-11-23 LAB — RAD ONC ARIA SESSION SUMMARY
Course Elapsed Days: 41
Plan Fractions Treated to Date: 2
Plan Prescribed Dose Per Fraction: 1.8 Gy
Plan Total Fractions Prescribed: 13
Plan Total Prescribed Dose: 23.4 Gy
Reference Point Dosage Given to Date: 3.6 Gy
Reference Point Session Dosage Given: 1.8 Gy
Session Number: 27

## 2023-11-24 ENCOUNTER — Ambulatory Visit
Admission: RE | Admit: 2023-11-24 | Discharge: 2023-11-24 | Disposition: A | Discharge status: Home / Self Care | Payer: Medicare PPO | Source: Ambulatory Visit | Attending: Radiation Oncology | Admitting: Radiation Oncology

## 2023-11-24 ENCOUNTER — Other Ambulatory Visit: Payer: Self-pay

## 2023-11-24 DIAGNOSIS — Z510 Encounter for antineoplastic radiation therapy: Secondary | ICD-10-CM | POA: Diagnosis not present

## 2023-11-24 LAB — RAD ONC ARIA SESSION SUMMARY
Course Elapsed Days: 42
Plan Fractions Treated to Date: 3
Plan Prescribed Dose Per Fraction: 1.8 Gy
Plan Total Fractions Prescribed: 13
Plan Total Prescribed Dose: 23.4 Gy
Reference Point Dosage Given to Date: 5.4 Gy
Reference Point Session Dosage Given: 1.8 Gy
Session Number: 28

## 2023-11-25 ENCOUNTER — Ambulatory Visit
Admission: RE | Admit: 2023-11-25 | Discharge: 2023-11-25 | Disposition: A | Discharge status: Home / Self Care | Payer: Medicare PPO | Source: Ambulatory Visit | Attending: Radiation Oncology | Admitting: Radiation Oncology

## 2023-11-25 ENCOUNTER — Other Ambulatory Visit: Payer: Self-pay

## 2023-11-25 DIAGNOSIS — Z510 Encounter for antineoplastic radiation therapy: Secondary | ICD-10-CM | POA: Diagnosis not present

## 2023-11-25 LAB — RAD ONC ARIA SESSION SUMMARY
Course Elapsed Days: 43
Plan Fractions Treated to Date: 4
Plan Prescribed Dose Per Fraction: 1.8 Gy
Plan Total Fractions Prescribed: 13
Plan Total Prescribed Dose: 23.4 Gy
Reference Point Dosage Given to Date: 7.2 Gy
Reference Point Session Dosage Given: 1.8 Gy
Session Number: 29

## 2023-11-26 ENCOUNTER — Ambulatory Visit
Admission: RE | Admit: 2023-11-26 | Discharge: 2023-11-26 | Disposition: A | Discharge status: Home / Self Care | Payer: Medicare PPO | Source: Ambulatory Visit | Attending: Radiation Oncology | Admitting: Radiation Oncology

## 2023-11-26 ENCOUNTER — Other Ambulatory Visit: Payer: Self-pay

## 2023-11-26 DIAGNOSIS — Z510 Encounter for antineoplastic radiation therapy: Secondary | ICD-10-CM | POA: Diagnosis not present

## 2023-11-26 LAB — RAD ONC ARIA SESSION SUMMARY
Course Elapsed Days: 44
Plan Fractions Treated to Date: 5
Plan Prescribed Dose Per Fraction: 1.8 Gy
Plan Total Fractions Prescribed: 13
Plan Total Prescribed Dose: 23.4 Gy
Reference Point Dosage Given to Date: 9 Gy
Reference Point Session Dosage Given: 1.8 Gy
Session Number: 30

## 2023-11-27 ENCOUNTER — Ambulatory Visit
Admission: RE | Admit: 2023-11-27 | Discharge: 2023-11-27 | Disposition: A | Discharge status: Home / Self Care | Payer: Medicare PPO | Source: Ambulatory Visit | Attending: Radiation Oncology | Admitting: Radiation Oncology

## 2023-11-27 ENCOUNTER — Other Ambulatory Visit: Payer: Self-pay

## 2023-11-27 DIAGNOSIS — Z510 Encounter for antineoplastic radiation therapy: Secondary | ICD-10-CM | POA: Diagnosis not present

## 2023-11-27 LAB — RAD ONC ARIA SESSION SUMMARY
Course Elapsed Days: 45
Plan Fractions Treated to Date: 6
Plan Prescribed Dose Per Fraction: 1.8 Gy
Plan Total Fractions Prescribed: 13
Plan Total Prescribed Dose: 23.4 Gy
Reference Point Dosage Given to Date: 10.8 Gy
Reference Point Session Dosage Given: 1.8 Gy
Session Number: 31

## 2023-11-30 ENCOUNTER — Other Ambulatory Visit: Payer: Self-pay

## 2023-11-30 ENCOUNTER — Ambulatory Visit
Admission: RE | Admit: 2023-11-30 | Discharge: 2023-11-30 | Disposition: A | Discharge status: Home / Self Care | Payer: Medicare PPO | Source: Ambulatory Visit | Attending: Radiation Oncology | Admitting: Radiation Oncology

## 2023-11-30 DIAGNOSIS — C61 Malignant neoplasm of prostate: Secondary | ICD-10-CM | POA: Insufficient documentation

## 2023-11-30 DIAGNOSIS — Z510 Encounter for antineoplastic radiation therapy: Secondary | ICD-10-CM | POA: Insufficient documentation

## 2023-11-30 LAB — RAD ONC ARIA SESSION SUMMARY
Course Elapsed Days: 48
Plan Fractions Treated to Date: 7
Plan Prescribed Dose Per Fraction: 1.8 Gy
Plan Total Fractions Prescribed: 13
Plan Total Prescribed Dose: 23.4 Gy
Reference Point Dosage Given to Date: 12.6 Gy
Reference Point Session Dosage Given: 1.8 Gy
Session Number: 32

## 2023-12-01 ENCOUNTER — Other Ambulatory Visit (HOSPITAL_BASED_OUTPATIENT_CLINIC_OR_DEPARTMENT_OTHER): Payer: Self-pay

## 2023-12-01 ENCOUNTER — Other Ambulatory Visit: Payer: Self-pay

## 2023-12-01 ENCOUNTER — Ambulatory Visit
Admission: RE | Admit: 2023-12-01 | Discharge: 2023-12-01 | Disposition: A | Discharge status: Home / Self Care | Payer: Medicare PPO | Source: Ambulatory Visit | Attending: Radiation Oncology | Admitting: Radiation Oncology

## 2023-12-01 DIAGNOSIS — Z510 Encounter for antineoplastic radiation therapy: Secondary | ICD-10-CM | POA: Diagnosis not present

## 2023-12-01 LAB — RAD ONC ARIA SESSION SUMMARY
Course Elapsed Days: 49
Plan Fractions Treated to Date: 8
Plan Prescribed Dose Per Fraction: 1.8 Gy
Plan Total Fractions Prescribed: 13
Plan Total Prescribed Dose: 23.4 Gy
Reference Point Dosage Given to Date: 14.4 Gy
Reference Point Session Dosage Given: 1.8 Gy
Session Number: 33

## 2023-12-01 MED ORDER — TRULICITY 0.75 MG/0.5ML ~~LOC~~ SOAJ
0.7500 mg | SUBCUTANEOUS | 3 refills | Status: DC
  Filled 2023-12-01: qty 2, 28d supply, fill #0

## 2023-12-02 ENCOUNTER — Ambulatory Visit
Admission: RE | Admit: 2023-12-02 | Discharge: 2023-12-02 | Disposition: A | Discharge status: Home / Self Care | Payer: Medicare PPO | Source: Ambulatory Visit | Attending: Radiation Oncology | Admitting: Radiation Oncology

## 2023-12-02 ENCOUNTER — Other Ambulatory Visit: Payer: Self-pay

## 2023-12-02 DIAGNOSIS — Z510 Encounter for antineoplastic radiation therapy: Secondary | ICD-10-CM | POA: Diagnosis not present

## 2023-12-02 LAB — RAD ONC ARIA SESSION SUMMARY
Course Elapsed Days: 50
Plan Fractions Treated to Date: 9
Plan Prescribed Dose Per Fraction: 1.8 Gy
Plan Total Fractions Prescribed: 13
Plan Total Prescribed Dose: 23.4 Gy
Reference Point Dosage Given to Date: 16.2 Gy
Reference Point Session Dosage Given: 1.8 Gy
Session Number: 34

## 2023-12-03 ENCOUNTER — Ambulatory Visit
Admission: RE | Admit: 2023-12-03 | Discharge: 2023-12-03 | Disposition: A | Discharge status: Home / Self Care | Payer: Medicare PPO | Source: Ambulatory Visit | Attending: Radiation Oncology | Admitting: Radiation Oncology

## 2023-12-03 ENCOUNTER — Other Ambulatory Visit: Payer: Self-pay

## 2023-12-03 DIAGNOSIS — Z510 Encounter for antineoplastic radiation therapy: Secondary | ICD-10-CM | POA: Diagnosis not present

## 2023-12-03 LAB — RAD ONC ARIA SESSION SUMMARY
Course Elapsed Days: 51
Plan Fractions Treated to Date: 10
Plan Prescribed Dose Per Fraction: 1.8 Gy
Plan Total Fractions Prescribed: 13
Plan Total Prescribed Dose: 23.4 Gy
Reference Point Dosage Given to Date: 18 Gy
Reference Point Session Dosage Given: 1.8 Gy
Session Number: 35

## 2023-12-04 ENCOUNTER — Other Ambulatory Visit: Payer: Self-pay

## 2023-12-04 ENCOUNTER — Ambulatory Visit
Admission: RE | Admit: 2023-12-04 | Discharge: 2023-12-04 | Disposition: A | Discharge status: Home / Self Care | Payer: Medicare PPO | Source: Ambulatory Visit | Attending: Radiation Oncology | Admitting: Radiation Oncology

## 2023-12-04 DIAGNOSIS — Z510 Encounter for antineoplastic radiation therapy: Secondary | ICD-10-CM | POA: Diagnosis not present

## 2023-12-04 LAB — RAD ONC ARIA SESSION SUMMARY
Course Elapsed Days: 52
Plan Fractions Treated to Date: 11
Plan Prescribed Dose Per Fraction: 1.8 Gy
Plan Total Fractions Prescribed: 13
Plan Total Prescribed Dose: 23.4 Gy
Reference Point Dosage Given to Date: 19.8 Gy
Reference Point Session Dosage Given: 1.8 Gy
Session Number: 36

## 2023-12-07 ENCOUNTER — Ambulatory Visit: Payer: Medicare PPO

## 2023-12-07 ENCOUNTER — Ambulatory Visit
Admission: RE | Admit: 2023-12-07 | Discharge: 2023-12-07 | Disposition: A | Discharge status: Home / Self Care | Payer: Medicare PPO | Source: Ambulatory Visit | Attending: Radiation Oncology | Admitting: Radiation Oncology

## 2023-12-07 ENCOUNTER — Other Ambulatory Visit: Payer: Self-pay

## 2023-12-07 DIAGNOSIS — Z510 Encounter for antineoplastic radiation therapy: Secondary | ICD-10-CM | POA: Diagnosis not present

## 2023-12-07 LAB — RAD ONC ARIA SESSION SUMMARY
Course Elapsed Days: 55
Plan Fractions Treated to Date: 12
Plan Prescribed Dose Per Fraction: 1.8 Gy
Plan Total Fractions Prescribed: 13
Plan Total Prescribed Dose: 23.4 Gy
Reference Point Dosage Given to Date: 21.6 Gy
Reference Point Session Dosage Given: 1.8 Gy
Session Number: 37

## 2023-12-08 ENCOUNTER — Ambulatory Visit
Admission: RE | Admit: 2023-12-08 | Discharge: 2023-12-08 | Disposition: A | Discharge status: Home / Self Care | Payer: Medicare PPO | Source: Ambulatory Visit | Attending: Radiation Oncology | Admitting: Radiation Oncology

## 2023-12-08 ENCOUNTER — Ambulatory Visit: Payer: Medicare PPO

## 2023-12-08 ENCOUNTER — Other Ambulatory Visit: Payer: Self-pay

## 2023-12-08 DIAGNOSIS — Z510 Encounter for antineoplastic radiation therapy: Secondary | ICD-10-CM | POA: Diagnosis not present

## 2023-12-08 DIAGNOSIS — C61 Malignant neoplasm of prostate: Secondary | ICD-10-CM

## 2023-12-08 LAB — RAD ONC ARIA SESSION SUMMARY
Course Elapsed Days: 56
Plan Fractions Treated to Date: 13
Plan Prescribed Dose Per Fraction: 1.8 Gy
Plan Total Fractions Prescribed: 13
Plan Total Prescribed Dose: 23.4 Gy
Reference Point Dosage Given to Date: 23.4 Gy
Reference Point Session Dosage Given: 1.8 Gy
Session Number: 38

## 2023-12-09 ENCOUNTER — Ambulatory Visit: Payer: Medicare PPO

## 2023-12-09 NOTE — Radiation Completion Notes (Addendum)
 Patient Name: Brian Beasley, Brian Beasley MRN: [id number] Date of Birth: 09/05/1961 Referring Physician: Crecencio Mc, M.D. Date of Service: 2023-12-09 Radiation Oncologist: Margaretmary Bayley, M.D. [facility]                             RADIATION ONCOLOGY END OF TREATMENT NOTE     Diagnosis:  63 y.o. gentleman with a rising, detectable PSA of 0.93 s/p RALP 10/2022 for Stage pT3bN1, Gleason 4+5 prostate cancer   Intent: Curative     ==========DELIVERED PLANS==========  First Treatment Date: 2023-10-13 Last Treatment Date: 2023-12-08   Plan Name: ProstBed_Pelv Site: Prostate Technique: IMRT Mode: Photon Dose Per Fraction: 1.8 Gy Prescribed Dose (Delivered / Prescribed): 45 Gy / 45 Gy Prescribed Fxs (Delivered / Prescribed): 25 / 25   Plan Name: Prostate_Bst Site: Prostate Technique: IMRT Mode: Photon Dose Per Fraction: 1.8 Gy Prescribed Dose (Delivered / Prescribed): 23.4 Gy / 23.4 Gy Prescribed Fxs (Delivered / Prescribed): 13 / 13     ==========ON TREATMENT VISIT DATES========== 2023-10-16, 2023-10-23, 2023-10-30, 2023-11-05, 2023-11-13, 2023-11-20, 2023-11-27, 2023-12-04   See weekly On Treatment Notes in Epic for details in the Media tab (listed as Progress notes on the On Treatment Visit Dates listed above).  He tolerated the treatments well with only modest fatigue and mild increased LUTS.  The patient will receive a call in about one month from the radiation oncology department. He will continue follow up with his urologist, Dr. Laverle Patter, with as well.  ------------------------------------------------   Margaretmary Dys, MD [facility]  Radiation Oncology Direct Dial: [phone]  Fax: [phone] [email or web address]  Skype  LinkedIn

## 2023-12-10 NOTE — Progress Notes (Signed)
Patient was presented to the [facility] on 06/23/23 for his rising, detectable PSA of 0.93 s/p RALP.  Patient proceed with treatment recommendations of 7.5 week course of of adjuvant external beam therapy concurrent with ADT and had his final radiation treatment on 12/08/23.   Patient is scheduled for a post treatment nurse call on 01/05/24 and has his first post treatment PSA on 02/10/24 at [facility].    RN left message for call back to review post treatment education.

## 2024-01-05 ENCOUNTER — Ambulatory Visit
Admission: RE | Admit: 2024-01-05 | Discharge: 2024-01-05 | Disposition: A | Discharge status: Home / Self Care | Payer: Self-pay | Source: Ambulatory Visit | Attending: Internal Medicine | Admitting: Internal Medicine

## 2024-01-05 NOTE — Progress Notes (Signed)
  Radiation Oncology         [phone] ________________________________  Name: Brian Beasley MRN: [id number]  Date of Service: 01/05/2024  DOB: Dec 03, 1960  Post Treatment Telephone Note  Diagnosis:  C61 Malignant neoplasm of prostate (as documented in provider EOT note)  Pre Treatment IPSS Score: 5 (as documented in the provider consult note)  The patient was not available for call today. Voicemail left.  Patient has a scheduled follow up visit with his urologist, Dr. Laverle Patter, on 5/25 for ongoing surveillance. He was counseled that PSA levels will be drawn in the urology office, and was reassured that additional time is expected to improve bowel and bladder symptoms. He was encouraged to call back with concerns or questions regarding radiation.    Ruel Favors, LPN

## 2024-02-04 ENCOUNTER — Other Ambulatory Visit: Payer: Self-pay | Admitting: Urology

## 2024-02-04 DIAGNOSIS — C61 Malignant neoplasm of prostate: Secondary | ICD-10-CM

## 2024-02-05 ENCOUNTER — Encounter: Payer: Self-pay | Admitting: *Deleted

## 2024-02-08 ENCOUNTER — Encounter: Payer: Self-pay | Admitting: *Deleted

## 2024-02-11 ENCOUNTER — Encounter: Payer: Self-pay | Admitting: *Deleted

## 2024-03-08 ENCOUNTER — Encounter: Payer: Self-pay | Admitting: *Deleted

## 2024-03-08 ENCOUNTER — Inpatient Hospital Stay: Attending: Internal Medicine | Admitting: *Deleted

## 2024-03-08 DIAGNOSIS — C61 Malignant neoplasm of prostate: Secondary | ICD-10-CM

## 2024-03-08 NOTE — Progress Notes (Addendum)
 SCP reviewed and completed. A copy sent to PCP.

## 2024-03-28 ENCOUNTER — Other Ambulatory Visit (HOSPITAL_BASED_OUTPATIENT_CLINIC_OR_DEPARTMENT_OTHER): Payer: Self-pay

## 2024-03-28 MED ORDER — LANTUS SOLOSTAR 100 UNIT/ML ~~LOC~~ SOPN
15.0000 [IU] | PEN_INJECTOR | Freq: Every day | SUBCUTANEOUS | 1 refills | Status: DC
  Filled 2024-03-28: qty 9, 30d supply, fill #0
  Filled 2024-05-09: qty 9, 30d supply, fill #1

## 2024-03-31 ENCOUNTER — Other Ambulatory Visit (HOSPITAL_BASED_OUTPATIENT_CLINIC_OR_DEPARTMENT_OTHER): Payer: Self-pay

## 2024-04-01 ENCOUNTER — Other Ambulatory Visit (HOSPITAL_BASED_OUTPATIENT_CLINIC_OR_DEPARTMENT_OTHER): Payer: Self-pay

## 2024-04-01 MED ORDER — INSULIN PEN NEEDLE 32G X 6 MM MISC
3 refills | Status: AC
  Filled 2024-04-01: qty 100, 90d supply, fill #0
  Filled 2024-07-06: qty 100, 30d supply, fill #0
  Filled 2024-08-02: qty 100, 30d supply, fill #1

## 2024-04-04 ENCOUNTER — Other Ambulatory Visit: Payer: Self-pay

## 2024-04-04 ENCOUNTER — Other Ambulatory Visit (HOSPITAL_BASED_OUTPATIENT_CLINIC_OR_DEPARTMENT_OTHER): Payer: Self-pay

## 2024-04-14 ENCOUNTER — Other Ambulatory Visit (HOSPITAL_BASED_OUTPATIENT_CLINIC_OR_DEPARTMENT_OTHER): Payer: Self-pay

## 2024-05-11 ENCOUNTER — Other Ambulatory Visit (HOSPITAL_BASED_OUTPATIENT_CLINIC_OR_DEPARTMENT_OTHER): Payer: Self-pay

## 2024-06-06 ENCOUNTER — Other Ambulatory Visit (HOSPITAL_BASED_OUTPATIENT_CLINIC_OR_DEPARTMENT_OTHER): Payer: Self-pay

## 2024-06-06 MED ORDER — LANTUS SOLOSTAR 100 UNIT/ML ~~LOC~~ SOPN
15.0000 [IU] | PEN_INJECTOR | Freq: Every day | SUBCUTANEOUS | 5 refills | Status: AC
  Filled 2024-06-06: qty 15, 100d supply, fill #0
  Filled 2024-08-02: qty 15, 50d supply, fill #1
  Filled 2024-10-09: qty 15, 50d supply, fill #2

## 2024-06-16 ENCOUNTER — Other Ambulatory Visit (HOSPITAL_BASED_OUTPATIENT_CLINIC_OR_DEPARTMENT_OTHER): Payer: Self-pay

## 2024-06-16 DIAGNOSIS — E66812 Obesity, class 2: Secondary | ICD-10-CM | POA: Diagnosis not present

## 2024-06-16 DIAGNOSIS — F1020 Alcohol dependence, uncomplicated: Secondary | ICD-10-CM | POA: Diagnosis not present

## 2024-06-16 DIAGNOSIS — E1165 Type 2 diabetes mellitus with hyperglycemia: Secondary | ICD-10-CM | POA: Diagnosis not present

## 2024-06-16 DIAGNOSIS — C61 Malignant neoplasm of prostate: Secondary | ICD-10-CM | POA: Diagnosis not present

## 2024-06-16 DIAGNOSIS — Z23 Encounter for immunization: Secondary | ICD-10-CM | POA: Diagnosis not present

## 2024-06-16 DIAGNOSIS — Z6835 Body mass index (BMI) 35.0-35.9, adult: Secondary | ICD-10-CM | POA: Diagnosis not present

## 2024-06-16 DIAGNOSIS — I10 Essential (primary) hypertension: Secondary | ICD-10-CM | POA: Diagnosis not present

## 2024-06-16 DIAGNOSIS — R7401 Elevation of levels of liver transaminase levels: Secondary | ICD-10-CM | POA: Diagnosis not present

## 2024-06-16 MED ORDER — TRULICITY 3 MG/0.5ML ~~LOC~~ SOAJ
3.0000 mg | SUBCUTANEOUS | 1 refills | Status: AC
  Filled 2024-06-16: qty 6, 84d supply, fill #0
  Filled 2024-08-02 – 2024-09-19 (×2): qty 6, 84d supply, fill #1

## 2024-06-20 ENCOUNTER — Other Ambulatory Visit (HOSPITAL_BASED_OUTPATIENT_CLINIC_OR_DEPARTMENT_OTHER): Payer: Self-pay

## 2024-07-06 ENCOUNTER — Other Ambulatory Visit (HOSPITAL_BASED_OUTPATIENT_CLINIC_OR_DEPARTMENT_OTHER): Payer: Self-pay

## 2024-07-07 DIAGNOSIS — Z872 Personal history of diseases of the skin and subcutaneous tissue: Secondary | ICD-10-CM | POA: Diagnosis not present

## 2024-07-07 DIAGNOSIS — L820 Inflamed seborrheic keratosis: Secondary | ICD-10-CM | POA: Diagnosis not present

## 2024-07-07 DIAGNOSIS — L821 Other seborrheic keratosis: Secondary | ICD-10-CM | POA: Diagnosis not present

## 2024-07-07 DIAGNOSIS — Z789 Other specified health status: Secondary | ICD-10-CM | POA: Diagnosis not present

## 2024-07-07 DIAGNOSIS — Z85828 Personal history of other malignant neoplasm of skin: Secondary | ICD-10-CM | POA: Diagnosis not present

## 2024-07-07 DIAGNOSIS — Z08 Encounter for follow-up examination after completed treatment for malignant neoplasm: Secondary | ICD-10-CM | POA: Diagnosis not present

## 2024-07-07 DIAGNOSIS — L538 Other specified erythematous conditions: Secondary | ICD-10-CM | POA: Diagnosis not present

## 2024-07-07 DIAGNOSIS — L2989 Other pruritus: Secondary | ICD-10-CM | POA: Diagnosis not present

## 2024-07-07 DIAGNOSIS — L814 Other melanin hyperpigmentation: Secondary | ICD-10-CM | POA: Diagnosis not present

## 2024-08-02 ENCOUNTER — Other Ambulatory Visit (HOSPITAL_BASED_OUTPATIENT_CLINIC_OR_DEPARTMENT_OTHER): Payer: Self-pay

## 2024-08-02 ENCOUNTER — Other Ambulatory Visit: Payer: Self-pay

## 2024-08-03 ENCOUNTER — Other Ambulatory Visit (HOSPITAL_BASED_OUTPATIENT_CLINIC_OR_DEPARTMENT_OTHER): Payer: Self-pay

## 2024-08-16 DIAGNOSIS — C61 Malignant neoplasm of prostate: Secondary | ICD-10-CM | POA: Diagnosis not present

## 2024-08-23 DIAGNOSIS — R399 Unspecified symptoms and signs involving the genitourinary system: Secondary | ICD-10-CM | POA: Diagnosis not present

## 2024-08-23 DIAGNOSIS — C61 Malignant neoplasm of prostate: Secondary | ICD-10-CM | POA: Diagnosis not present

## 2024-08-23 DIAGNOSIS — N393 Stress incontinence (female) (male): Secondary | ICD-10-CM | POA: Diagnosis not present

## 2024-09-06 DIAGNOSIS — H4321 Crystalline deposits in vitreous body, right eye: Secondary | ICD-10-CM | POA: Diagnosis not present

## 2024-09-06 DIAGNOSIS — H35033 Hypertensive retinopathy, bilateral: Secondary | ICD-10-CM | POA: Diagnosis not present

## 2024-09-06 DIAGNOSIS — H0102B Squamous blepharitis left eye, upper and lower eyelids: Secondary | ICD-10-CM | POA: Diagnosis not present

## 2024-09-06 DIAGNOSIS — H2513 Age-related nuclear cataract, bilateral: Secondary | ICD-10-CM | POA: Diagnosis not present

## 2024-09-06 DIAGNOSIS — H0102A Squamous blepharitis right eye, upper and lower eyelids: Secondary | ICD-10-CM | POA: Diagnosis not present

## 2024-09-06 DIAGNOSIS — E119 Type 2 diabetes mellitus without complications: Secondary | ICD-10-CM | POA: Diagnosis not present

## 2024-09-06 DIAGNOSIS — H40053 Ocular hypertension, bilateral: Secondary | ICD-10-CM | POA: Diagnosis not present

## 2024-09-15 DIAGNOSIS — F1020 Alcohol dependence, uncomplicated: Secondary | ICD-10-CM | POA: Diagnosis not present

## 2024-09-15 DIAGNOSIS — Z0000 Encounter for general adult medical examination without abnormal findings: Secondary | ICD-10-CM | POA: Diagnosis not present

## 2024-09-15 DIAGNOSIS — I10 Essential (primary) hypertension: Secondary | ICD-10-CM | POA: Diagnosis not present

## 2024-09-15 DIAGNOSIS — C61 Malignant neoplasm of prostate: Secondary | ICD-10-CM | POA: Diagnosis not present

## 2024-09-15 DIAGNOSIS — E1165 Type 2 diabetes mellitus with hyperglycemia: Secondary | ICD-10-CM | POA: Diagnosis not present

## 2024-09-15 DIAGNOSIS — C775 Secondary and unspecified malignant neoplasm of intrapelvic lymph nodes: Secondary | ICD-10-CM | POA: Diagnosis not present

## 2024-09-15 DIAGNOSIS — Z23 Encounter for immunization: Secondary | ICD-10-CM | POA: Diagnosis not present

## 2024-09-19 ENCOUNTER — Other Ambulatory Visit (HOSPITAL_BASED_OUTPATIENT_CLINIC_OR_DEPARTMENT_OTHER): Payer: Self-pay

## 2024-10-24 ENCOUNTER — Ambulatory Visit: Admitting: Cardiology

## 2024-11-08 ENCOUNTER — Encounter: Payer: Self-pay | Admitting: Cardiology

## 2024-11-08 ENCOUNTER — Telehealth: Payer: Self-pay

## 2024-11-08 ENCOUNTER — Ambulatory Visit: Admitting: Cardiology

## 2024-11-08 VITALS — BP 134/76 | HR 81 | Ht 73.0 in | Wt 275.6 lb

## 2024-11-08 DIAGNOSIS — G4733 Obstructive sleep apnea (adult) (pediatric): Secondary | ICD-10-CM | POA: Diagnosis not present

## 2024-11-08 DIAGNOSIS — I10 Essential (primary) hypertension: Secondary | ICD-10-CM | POA: Diagnosis not present

## 2024-11-08 NOTE — Progress Notes (Signed)
 "   Date:  11/08/2024   ID:  Brian Beasley, DOB 09-Jan-1961, MRN [id number]   PCP:  Regino Slater, MD  Sleep medicine:  Wilbert Bihari, MD Electrophysiologist:  None   Chief Complaint:  OSA  History of Present Illness:    Brian Beasley is a 64 y.o. male with a hx of OSA on CPAP.  He is doing well with his PAP device and thinks that he has gotten used to it.  He tolerates the full face mask and feels the pressure is adequate.  He feels rested in the am but not as much as he used to but has prostate cancer and he thinks the treatment has affected how he feels.  He has no significant daytime sleepiness.  He denies any significant  nasal dryness.  He does have problems with sinus congestion from time to time.  He also has bad mouth dryness at times.  He does not think that he snores. An Epworth Sleepiness Scale score was calculated the office today and this endorsed at 7 arguing against residual daytime sleepiness. Patient denies any episodes of bruxism, restless legs, No hypnogognic hallucinations or cataplectic events.  He thinks that his device if over 5-6 years and would like a new one.  Prior CV studies:   The following studies were reviewed today:  PAP compliance download  Past Medical History:  Diagnosis Date   Allergic rhinitis    Allergy    Arthritis    Cancer (HCC)    skin - basil cell   Diabetes mellitus without complication (HCC)    Family history of colon cancer    Father   GERD (gastroesophageal reflux disease)    Heart murmur    In teens, no murmur heard since   Hyperlipidemia    Hypertension    OSA (obstructive sleep apnea)    PSG 10/2008 AHI 26.4/hr   Prostate cancer (HCC)    Sleep apnea    Past Surgical History:  Procedure Laterality Date   BASAL CELL CARCINOMA EXCISION     right cheek   CHOLECYSTECTOMY N/A 12/01/2013   Procedure: LAPAROSCOPIC CHOLECYSTECTOMY WITH INTRAOPERATIVE CHOLANGIOGRAM;  Surgeon: Donnice POUR. Tsuei, MD;  Location: [facility];  Service:  General;  Laterality: N/A;   HAND SURGERY     L hand, had 3 screws put in to repair bone   KNEE SURGERY Right    LYMPHADENECTOMY Bilateral 11/13/2022   Procedure: BLILATERAL PELVIC LYMPHADENECTOMY;  Surgeon: Renda Glance, MD;  Location: [facility];  Service: Urology;  Laterality: Bilateral;   NASAL SINUS SURGERY     opened passages to improve drainage   PROSTATE BIOPSY     ROBOT ASSISTED LAPAROSCOPIC RADICAL PROSTATECTOMY N/A 11/13/2022   Procedure: XI ROBOTIC ASSISTED LAPAROSCOPIC RADICAL PROSTATECTOMY LEVEL 2;  Surgeon: Renda Glance, MD;  Location: [facility];  Service: Urology;  Laterality: N/A;     Current Meds  Medication Sig   Dulaglutide  (TRULICITY ) 3 MG/0.5ML SOAJ Inject 3 mg into the skin once a week.   fluticasone  (FLONASE ) 50 MCG/ACT nasal spray Place 2 sprays into both nostrils as needed for rhinitis.   glucose blood (ONETOUCH VERIO) test strip USE AS DIRECTE TWICE DAILY for 90   hydrochlorothiazide  (HYDRODIURIL ) 12.5 MG tablet Take 12.5 mg by mouth daily.   hydrochlorothiazide  (HYDRODIURIL ) 12.5 MG tablet Take 1 tablet (12.5 mg total) by mouth every morning.   hydroCHLOROthiazide  (INZIRQO  PO) Take 12.5 mg by mouth daily.   insulin  glargine (LANTUS  SOLOSTAR) 100 UNIT/ML Solostar Pen  Inject 15 Units into the skin at bedtime. (max 30 units)   Insulin  Pen Needle 32G X 6 MM MISC Use as directed   Lancets (ONETOUCH DELICA PLUS LANCET33G) MISC TEST TWICE DAILY AS DIRECTED for 100   losartan  (COZAAR ) 50 MG tablet Take 1 tablet (50 mg total) by mouth daily.   metFORMIN  (GLUCOPHAGE ) 1000 MG tablet Take 1 tablet (1,000 mg total) by mouth 2 (two) times daily.   NON FORMULARY Pt uses a cpap nightly   omeprazole  (PRILOSEC  OTC) 20 MG tablet Take 20 mg by mouth daily.   rosuvastatin  (CRESTOR ) 5 MG tablet Take 5 mg by mouth daily.   tadalafil (CIALIS) 5 MG tablet Take 5 mg by mouth daily.     Allergies:   Lisinopril, Welchol [colesevelam hcl], and Zocor [simvastatin]   Social History    Tobacco Use   Smoking status: Never   Smokeless tobacco: Current    Types: Chew  Vaping Use   Vaping status: Never Used  Substance Use Topics   Alcohol use: Yes    Comment: few alcoholic beverages per week   Drug use: No     Family Hx: The patient's family history includes Diabetes in his mother; Heart disease in his mother; Melanoma in his mother; Prostate cancer (age of onset: 12 - 68) in his father. There is no history of Esophageal cancer, Rectal cancer, or Stomach cancer.  ROS:   Please see the history of present illness.     All other systems reviewed and are negative.   Labs/Other Tests and Data Reviewed:    Recent Labs: No results found for requested labs within last 365 days.   Recent Lipid Panel No results found for: CHOL, TRIG, HDL, CHOLHDL, LDLCALC, LDLDIRECT  Wt Readings from Last 3 Encounters:  11/08/24 275 lb 9.6 oz (125 kg)  06/23/23 281 lb 3.2 oz (127.6 kg)  11/13/22 279 lb 6.4 oz (126.7 kg)     Objective:    Vital Signs:  BP 134/76   Pulse 81   Ht 6' 1 (1.854 m)   Wt 275 lb 9.6 oz (125 kg)   SpO2 92%   BMI 36.36 kg/m   GEN: Well nourished, well developed in no acute distress HEENT: Normal NECK: No JVD; No carotid bruits LYMPHATICS: No lymphadenopathy CARDIAC:RRR, no murmurs, rubs, gallops RESPIRATORY:  Clear to auscultation without rales, wheezing or rhonchi  ABDOMEN: Soft, non-tender, non-distended MUSCULOSKELETAL:  No edema; No deformity  SKIN: Warm and dry NEUROLOGIC:  Alert and oriented x 3 PSYCHIATRIC:  Normal affect   ASSESSMENT & PLAN:    OSA - The patient is tolerating PAP therapy well without any problems. The PAP download performed by his DME was personally reviewed and interpreted by me today and showed an AHI of 1.1 /hr on 13 cm H2O with 100% compliance in using more than 4 hours nightly.  The patient has been using and benefiting from PAP use and will continue to benefit from therapy.  -his device is over 16  years old so I will order a new ResMed Airsense 11 CPAP at 13cm H2O with heated humidity and mask of choice -encouraged him to try to adjust the humidity in his device to help with nasal congestion and mouth dryness  HTN -BP controlled on exam today -Continue HCTZ 12.5 mg daily, losartan  50 mg daily with as needed refills  Medication Adjustments/Labs and Tests Ordered: Current medicines are reviewed at length with the patient today.  Concerns regarding medicines are outlined  above.  Tests Ordered: No orders of the defined types were placed in this encounter.  Medication Changes: No orders of the defined types were placed in this encounter.   Disposition:  Follow up in 6-8 weeks after getting his new device  Signed, Wilbert Bihari, MD  11/08/2024 2:53 PM    [facility] "

## 2024-11-08 NOTE — Patient Instructions (Signed)
 Medication Instructions:  Your physician recommends that you continue on your current medications as directed. Please refer to the Current Medication list given to you today.  *If you need a refill on your cardiac medications before your next appointment, please call your pharmacy*  Lab Work: None.  If you have labs (blood work) drawn today and your tests are completely normal, you will receive your results only by: MyChart Message (if you have MyChart) OR A paper copy in the mail If you have any lab test that is abnormal or we need to change your treatment, we will call you to review the results.  Testing/Procedures: None.  Follow-Up: At [facility], you and your health needs are our priority.  As part of our continuing mission to provide you with exceptional heart care, our providers are all part of one team.  This team includes your primary Cardiologist (physician) and Advanced Practice Providers or APPs (Physician Assistants and Nurse Practitioners) who all work together to provide you with the care you need, when you need it.  Your next appointment:   Will be dependent on delivery and set up of your cpap device.  Provider:   Dr. Wilbert Bihari, MD

## 2024-11-08 NOTE — Telephone Encounter (Signed)
 SABRA

## 2024-11-14 ENCOUNTER — Telehealth: Payer: Self-pay | Admitting: *Deleted

## 2024-11-14 DIAGNOSIS — G4733 Obstructive sleep apnea (adult) (pediatric): Secondary | ICD-10-CM

## 2024-11-14 NOTE — Telephone Encounter (Signed)
-----   Message from Wilbert Bihari, MD sent at 11/08/2024  3:00 PM EST ----- order a new ResMed Airsense 11 CPAP at 13cm H2O with heated humidity and mask of choice

## 2024-11-14 NOTE — Telephone Encounter (Signed)
Order placed to [facility] via community message.
